# Patient Record
Sex: Female | Born: 1945 | Race: White | Hispanic: No | Smoking: Never smoker
Health system: Southern US, Community
[De-identification: ages and names within clinical notes are randomized; demographics above are authoritative.]

## PROBLEM LIST (undated history)

## (undated) DIAGNOSIS — C189 Malignant neoplasm of colon, unspecified: Secondary | ICD-10-CM

## (undated) DIAGNOSIS — E119 Type 2 diabetes mellitus without complications: Secondary | ICD-10-CM

## (undated) HISTORY — PX: OTHER SURGICAL HISTORY: SHX169

## (undated) HISTORY — DX: Type 2 diabetes mellitus without complications: E11.9

## (undated) HISTORY — DX: Malignant neoplasm of colon, unspecified: C18.9

---

## 2005-08-17 ENCOUNTER — Emergency Department (HOSPITAL_COMMUNITY): Admission: EM | Admit: 2005-08-17 | Discharge: 2005-08-17 | Payer: Self-pay | Admitting: Emergency Medicine

## 2015-06-25 ENCOUNTER — Ambulatory Visit: Payer: Self-pay | Admitting: "Endocrinology

## 2015-08-23 ENCOUNTER — Ambulatory Visit: Payer: Self-pay | Admitting: "Endocrinology

## 2015-10-02 ENCOUNTER — Ambulatory Visit: Payer: Self-pay | Admitting: "Endocrinology

## 2015-10-21 ENCOUNTER — Other Ambulatory Visit: Payer: Self-pay | Admitting: "Endocrinology

## 2015-10-21 ENCOUNTER — Ambulatory Visit (INDEPENDENT_AMBULATORY_CARE_PROVIDER_SITE_OTHER): Payer: PPO | Admitting: "Endocrinology

## 2015-10-21 ENCOUNTER — Encounter: Payer: Self-pay | Admitting: "Endocrinology

## 2015-10-21 VITALS — BP 162/72 | HR 92 | Ht 62.0 in | Wt 275.0 lb

## 2015-10-21 DIAGNOSIS — I10 Essential (primary) hypertension: Secondary | ICD-10-CM | POA: Diagnosis not present

## 2015-10-21 DIAGNOSIS — E785 Hyperlipidemia, unspecified: Secondary | ICD-10-CM | POA: Insufficient documentation

## 2015-10-21 DIAGNOSIS — E1165 Type 2 diabetes mellitus with hyperglycemia: Secondary | ICD-10-CM | POA: Insufficient documentation

## 2015-10-21 DIAGNOSIS — E118 Type 2 diabetes mellitus with unspecified complications: Secondary | ICD-10-CM

## 2015-10-21 DIAGNOSIS — IMO0002 Reserved for concepts with insufficient information to code with codable children: Secondary | ICD-10-CM | POA: Insufficient documentation

## 2015-10-21 MED ORDER — GLUCOSE BLOOD VI STRP: ORAL_STRIP | Status: DC

## 2015-10-21 NOTE — Progress Notes (Signed)
Subjective:    Patient ID: Deborah Love, female    DOB: 1946/05/27. Patient is being seen in consultation for management of diabetes requested by  VYAS,DHRUV B., MD  Past Medical History  Diagnosis Date  . Diabetes mellitus, type II ([place])   . Colon cancer [facility])    Past Surgical History  Procedure Laterality Date  . Colon cancer     Social History   Social History  . Marital Status: Divorced    Spouse Name: N/A  . Number of Children: N/A  . Years of Education: N/A   Social History Main Topics  . Smoking status: Never Smoker   . Smokeless tobacco: None  . Alcohol Use: None  . Drug Use: No  . Sexual Activity: Not Asked   Other Topics Concern  . None   Social History Narrative  . None   Outpatient Encounter Prescriptions as of 10/21/2015  Medication Sig  . aspirin 81 MG tablet Take 81 mg by mouth daily.  . Canagliflozin (INVOKANA PO) Take 100 mg by mouth. Taking samples from PMD  . lisinopril (PRINIVIL,ZESTRIL) 10 MG tablet Take 10 mg by mouth daily.  . metFORMIN (GLUCOPHAGE) 1000 MG tablet Take 1,000 mg by mouth 2 (two) times daily with a meal.  . glucose blood (ONE TOUCH TEST STRIPS) test strip Use as instructed   No facility-administered encounter medications on file as of 10/21/2015.   ALLERGIES: Allergies  Allergen Reactions  . Penicillins    VACCINATION STATUS:  There is no immunization history on file for this patient.  Diabetes She presents for her initial diabetic visit. She has type 2 diabetes mellitus. Onset time: She was diagnosed at approximate age of 61 years. Her disease course has been worsening. There are no hypoglycemic associated symptoms. Pertinent negatives for hypoglycemia include no confusion, headaches, pallor or seizures. Associated symptoms include fatigue, polydipsia and polyuria. Pertinent negatives for diabetes include no chest pain and no polyphagia. There are no hypoglycemic complications. Symptoms are worsening. There are no diabetic  complications. Risk factors for coronary artery disease include diabetes mellitus, hypertension, obesity and sedentary lifestyle. Her weight is decreasing steadily. She is following a generally unhealthy diet. She has not had a previous visit with a dietitian. She rarely participates in exercise. Home blood sugar record trend: She did not bring any meter nor log to review today. Eye exam is current.  Hypertension This is a chronic problem. The current episode started more than 1 year ago. Pertinent negatives include no chest pain, headaches, palpitations or shortness of breath. Risk factors for coronary artery disease include diabetes mellitus, obesity and sedentary lifestyle. Past treatments include ACE inhibitors.      Review of Systems  Constitutional: Positive for fatigue. Negative for fever, chills and unexpected weight change.  HENT: Negative for trouble swallowing and voice change.   Eyes: Negative for visual disturbance.  Respiratory: Negative for cough, shortness of breath and wheezing.   Cardiovascular: Negative for chest pain, palpitations and leg swelling.  Gastrointestinal: Negative for nausea, vomiting and diarrhea.  Endocrine: Positive for polydipsia and polyuria. Negative for cold intolerance, heat intolerance and polyphagia.  Musculoskeletal: Negative for myalgias and arthralgias.  Skin: Negative for color change, pallor, rash and wound.  Neurological: Negative for seizures and headaches.  Psychiatric/Behavioral: Negative for suicidal ideas and confusion.    Objective:    BP 162/72 mmHg  Pulse 92  Ht 5\' 2"  (1.575 m)  Wt 275 lb (124.739 kg)  BMI 50.29 kg/m2  SpO2  95%  Wt Readings from Last 3 Encounters:  10/21/15 275 lb (124.739 kg)    Physical Exam  Constitutional: She is oriented to person, place, and time. She appears well-developed.  HENT:  Head: Atraumatic.  Eyes: EOM are normal.  Neck: Normal range of motion. Neck supple. No tracheal deviation present. No  thyromegaly present.  Cardiovascular: Normal rate and regular rhythm.   Pulmonary/Chest: Effort normal and breath sounds normal.  Abdominal: Soft. Bowel sounds are normal. There is no tenderness. There is no guarding.  Truncal obesity.  Musculoskeletal: Normal range of motion. She exhibits no edema.  Neurological: She is alert and oriented to person, place, and time. She has normal reflexes. No cranial nerve deficit. Coordination normal.  Skin: Skin is warm and dry. No rash noted. No erythema. No pallor.  Psychiatric: She has a normal mood and affect. Judgment normal.    Her referral paperwork short A1c of 10.7% from 05/07/2015.  She has no recent labs to review.  Assessment & Plan:   1. Uncontrolled type 2 diabetes mellitus with complication, without long-term current use of insulin ([place])  - Patient has currently uncontrolled symptomatic type 2 DM since  70 years of age,  with most recent A1c of 10.7 %. -No recent labs to review.  I will send her to lab for new set of labs.   Her diabetes is complicated by obesity and patient remains at a high risk for more acute and chronic complications of diabetes which include CAD, CVA, CKD, retinopathy, and neuropathy. These are all discussed in detail with the patient.  - I have counseled the patient on diet management and weight loss, by adopting a carbohydrate restricted/protein rich diet.  - Suggestion is made for patient to avoid simple carbohydrates   from their diet including Cakes , Desserts, Ice Cream,  Soda (  diet and regular) , Sweet Tea , Candies,  Chips, Cookies, Artificial Sweeteners,   and "Sugar-free" Products . This will help patient to have stable blood glucose profile and potentially avoid unintended weight gain.  - I encouraged the patient to switch to  unprocessed or minimally processed complex starch and increased protein intake (animal or plant source), fruits, and vegetables.  - Patient is advised to stick to a routine  mealtimes to eat 3 meals  a day and avoid unnecessary snacks ( to snack only to correct hypoglycemia).  - The patient will be scheduled with Jearld Fenton, RDN, CDE for individualized DM education.  - I have approached patient with the following individualized plan to manage diabetes and patient agrees:   - I  will initiate  strict monitoring of glucose  AC and HS. -She may need at least basal insulin based on her readings.  -Patient is encouraged to call clinic for blood glucose levels less than 70 or above 300 mg /dl. - I will continue  metformin 1000 mg by mouth twice a day and INVOKANA for now until we get a chance to review her repeat renal function. - Patient will be considered for incretin therapy as appropriate next visit. - Patient specific target  A1c;  LDL, HDL, Triglycerides, and  Waist Circumference were discussed in detail.  2) BP/HTN:  Uncontrolled. Continue current medications including ACEI/ARB. 3) Lipids/HPL:  Lipid panel unknown, she is not on statins. 4)  Weight/Diet: CDE Consult will be initiated , exercise, and detailed carbohydrates information provided.  5) Chronic Care/Health Maintenance:  -Patient is  ACEI medications and encouraged to continue to follow up  with Ophthalmology, Podiatrist at least yearly or according to recommendations, and advised to   stay away from smoking. I have recommended yearly flu vaccine and pneumonia vaccination at least every 5 years; moderate intensity exercise for up to 150 minutes weekly; and  sleep for at least 7 hours a day.  - 60 minutes of time was spent on the care of this patient , 50% of which was applied for counseling on diabetes complications and their preventions.  - Patient to bring meter and  blood glucose logs during their next visit.   - I advised patient to maintain close follow up with VYAS,DHRUV B., MD for primary care needs.  Follow up plan: - Return in about 10 days (around 10/31/2015) for diabetes, high blood  pressure, follow up with pre-visit labs, meter, and logs.  Glade Lloyd, MD Phone: [phone]  Fax: [phone]   10/21/2015, 3:05 PM

## 2015-10-22 ENCOUNTER — Other Ambulatory Visit: Payer: Self-pay

## 2015-10-22 LAB — BASIC METABOLIC PANEL
BUN / CREAT RATIO: 27 — AB (ref 11–26)
BUN: 23 mg/dL (ref 8–27)
CHLORIDE: 99 mmol/L (ref 96–106)
CO2: 22 mmol/L (ref 18–29)
Calcium: 10.2 mg/dL (ref 8.7–10.3)
Creatinine, Ser: 0.85 mg/dL (ref 0.57–1.00)
GFR calc Af Amer: 82 mL/min/{1.73_m2} (ref >59)
GFR calc non Af Amer: 71 mL/min/{1.73_m2} (ref >59)
GLUCOSE: 118 mg/dL — AB (ref 65–99)
POTASSIUM: 4.7 mmol/L (ref 3.5–5.2)
SODIUM: 140 mmol/L (ref 134–144)

## 2015-10-22 LAB — T4, FREE: FREE T4: 0.98 ng/dL (ref 0.82–1.77)

## 2015-10-22 LAB — TSH: TSH: 2.35 u[IU]/mL (ref 0.450–4.500)

## 2015-10-22 LAB — HGB A1C W/O EAG: HEMOGLOBIN A1C: 7.4 % — AB (ref 4.8–5.6)

## 2015-10-22 MED ORDER — GLUCOSE BLOOD VI STRP: ORAL_STRIP | Status: DC

## 2015-10-24 ENCOUNTER — Other Ambulatory Visit: Payer: Self-pay

## 2015-10-24 MED ORDER — GLUCOSE BLOOD VI STRP: ORAL_STRIP | Status: AC

## 2015-10-31 ENCOUNTER — Other Ambulatory Visit: Payer: Self-pay

## 2015-10-31 ENCOUNTER — Ambulatory Visit (INDEPENDENT_AMBULATORY_CARE_PROVIDER_SITE_OTHER): Payer: PPO | Admitting: "Endocrinology

## 2015-10-31 ENCOUNTER — Encounter: Payer: Self-pay | Admitting: "Endocrinology

## 2015-10-31 VITALS — BP 154/81 | HR 68 | Ht 62.0 in | Wt 277.0 lb

## 2015-10-31 DIAGNOSIS — E1165 Type 2 diabetes mellitus with hyperglycemia: Secondary | ICD-10-CM | POA: Diagnosis not present

## 2015-10-31 DIAGNOSIS — I10 Essential (primary) hypertension: Secondary | ICD-10-CM | POA: Diagnosis not present

## 2015-10-31 DIAGNOSIS — E118 Type 2 diabetes mellitus with unspecified complications: Secondary | ICD-10-CM

## 2015-10-31 DIAGNOSIS — E785 Hyperlipidemia, unspecified: Secondary | ICD-10-CM | POA: Diagnosis not present

## 2015-10-31 DIAGNOSIS — IMO0002 Reserved for concepts with insufficient information to code with codable children: Secondary | ICD-10-CM

## 2015-10-31 MED ORDER — CANAGLIFLOZIN 100 MG PO TABS: 100.0000 mg | ORAL_TABLET | Freq: Every day | ORAL | Status: DC

## 2015-10-31 NOTE — Patient Instructions (Signed)

## 2015-10-31 NOTE — Progress Notes (Signed)
Subjective:    Patient ID: Deborah Love, female    DOB: 03-03-1946. Patient is being seen in  Follow-up for  management of diabetes requested by  VYAS,DHRUV B., MD  Past Medical History  Diagnosis Date  . Diabetes mellitus, type II ([place])   . Colon cancer [facility])    Past Surgical History  Procedure Laterality Date  . Colon cancer     Social History   Social History  . Marital Status: Divorced    Spouse Name: N/A  . Number of Children: N/A  . Years of Education: N/A   Social History Main Topics  . Smoking status: Never Smoker   . Smokeless tobacco: None  . Alcohol Use: None  . Drug Use: No  . Sexual Activity: Not Asked   Other Topics Concern  . None   Social History Narrative   Outpatient Encounter Prescriptions as of 10/31/2015  Medication Sig  . aspirin 81 MG tablet Take 81 mg by mouth daily.  . canagliflozin (INVOKANA) 100 MG TABS tablet Take 1 tablet (100 mg total) by mouth daily before breakfast. Taking samples from PMD  . glucose blood (ONETOUCH VERIO) test strip Use as instructed 4 x daily. E11.65  . lisinopril (PRINIVIL,ZESTRIL) 10 MG tablet Take 10 mg by mouth daily.  . metFORMIN (GLUCOPHAGE) 1000 MG tablet Take 1,000 mg by mouth 2 (two) times daily with a meal.  . [DISCONTINUED] Canagliflozin (INVOKANA PO) Take 100 mg by mouth. Taking samples from PMD   No facility-administered encounter medications on file as of 10/31/2015.   ALLERGIES: Allergies  Allergen Reactions  . Penicillins    VACCINATION STATUS:  There is no immunization history on file for this patient.  Diabetes She presents for her follow-up diabetic visit. She has type 2 diabetes mellitus. Onset time: She was diagnosed at approximate age of 4 years. Her disease course has been improving. There are no hypoglycemic associated symptoms. Pertinent negatives for hypoglycemia include no confusion, headaches, pallor or seizures. Associated symptoms include fatigue. Pertinent negatives for  diabetes include no chest pain, no polydipsia, no polyphagia and no polyuria. There are no hypoglycemic complications. Symptoms are improving. There are no diabetic complications. Risk factors for coronary artery disease include diabetes mellitus, hypertension, obesity and sedentary lifestyle. Her weight is stable. She is following a generally unhealthy diet. She has not had a previous visit with a dietitian. She rarely participates in exercise. Home blood sugar record trend: She came with average blood glucose of 123 in the last week. Her breakfast blood glucose range is generally 110-130 mg/dl. Her lunch blood glucose range is generally 110-130 mg/dl. Her dinner blood glucose range is generally 110-130 mg/dl. Her overall blood glucose range is 110-130 mg/dl. Eye exam is current.  Hypertension This is a chronic problem. The current episode started more than 1 year ago. Pertinent negatives include no chest pain, headaches, palpitations or shortness of breath. Risk factors for coronary artery disease include diabetes mellitus, obesity and sedentary lifestyle. Past treatments include ACE inhibitors.      Review of Systems  Constitutional: Positive for fatigue. Negative for fever, chills and unexpected weight change.  HENT: Negative for trouble swallowing and voice change.   Eyes: Negative for visual disturbance.  Respiratory: Negative for cough, shortness of breath and wheezing.   Cardiovascular: Negative for chest pain, palpitations and leg swelling.  Gastrointestinal: Negative for nausea, vomiting and diarrhea.  Endocrine: Negative for cold intolerance, heat intolerance, polydipsia, polyphagia and polyuria.  Musculoskeletal: Negative for myalgias  and arthralgias.  Skin: Negative for color change, pallor, rash and wound.  Neurological: Negative for seizures and headaches.  Psychiatric/Behavioral: Negative for suicidal ideas and confusion.    Objective:    BP 154/81 mmHg  Pulse 68  Ht 5\' 2"   (1.575 m)  Wt 277 lb (125.646 kg)  BMI 50.65 kg/m2  SpO2 98%  Wt Readings from Last 3 Encounters:  10/31/15 277 lb (125.646 kg)  10/21/15 275 lb (124.739 kg)    Physical Exam  Constitutional: She is oriented to person, place, and time. She appears well-developed.  HENT:  Head: Atraumatic.  Eyes: EOM are normal.  Neck: Normal range of motion. Neck supple. No tracheal deviation present. No thyromegaly present.  Cardiovascular: Normal rate and regular rhythm.   Pulmonary/Chest: Effort normal and breath sounds normal.  Abdominal: Soft. Bowel sounds are normal. There is no tenderness. There is no guarding.  Truncal obesity.  Musculoskeletal: Normal range of motion. She exhibits no edema.  Neurological: She is alert and oriented to person, place, and time. She has normal reflexes. No cranial nerve deficit. Coordination normal.  Skin: Skin is warm and dry. No rash noted. No erythema. No pallor.  Psychiatric: She has a normal mood and affect. Judgment normal.    Her referral paperwork short A1c of 10.7% from 05/07/2015.  She has no recent labs to review.  Assessment & Plan:   1. Uncontrolled type 2 diabetes mellitus with complication, without long-term current use of insulin ([place])  - Patient has currently uncontrolled symptomatic type 2 DM since  70 years of age,  with most recent A1c of 7.4% improving from 10.7 %.    Her diabetes is complicated by obesity and patient remains at a high risk for more acute and chronic complications of diabetes which include CAD, CVA, CKD, retinopathy, and neuropathy. These are all discussed in detail with the patient.  - I have counseled the patient on diet management and weight loss, by adopting a carbohydrate restricted/protein rich diet.  - Suggestion is made for patient to avoid simple carbohydrates   from their diet including Cakes , Desserts, Ice Cream,  Soda (  diet and regular) , Sweet Tea , Candies,  Chips, Cookies, Artificial Sweeteners,   and  "Sugar-free" Products . This will help patient to have stable blood glucose profile and potentially avoid unintended weight gain.  - I encouraged the patient to switch to  unprocessed or minimally processed complex starch and increased protein intake (animal or plant source), fruits, and vegetables.  - Patient is advised to stick to a routine mealtimes to eat 3 meals  a day and avoid unnecessary snacks ( to snack only to correct hypoglycemia).  - The patient will be scheduled with Jearld Fenton, RDN, CDE for individualized DM education.  - I have approached patient with the following individualized plan to manage diabetes and patient agrees:   -She will not need  insulin therapy based on her readings.  - I will continue  metformin 1000 mg by mouth twice a day and INVOKANA 100 mg with breakfast .  - Patient will be considered for incretin therapy as appropriate next visit. - Patient specific target  A1c;  LDL, HDL, Triglycerides, and  Waist Circumference were discussed in detail.  2) BP/HTN:  Uncontrolled. Continue current medications including ACEI/ARB. 3) Lipids/HPL:  Lipid panel unknown, she is not on statins. 4)  Weight/Diet: CDE Consult will be initiated , exercise, and detailed carbohydrates information provided.  5) Chronic Care/Health Maintenance:  -  Patient is  ACEI medications and encouraged to continue to follow up with Ophthalmology, Podiatrist at least yearly or according to recommendations, and advised to   stay away from smoking. I have recommended yearly flu vaccine and pneumonia vaccination at least every 5 years; moderate intensity exercise for up to 150 minutes weekly; and  sleep for at least 7 hours a day.  - 25 minutes of time was spent on the care of this patient , 50% of which was applied for counseling on diabetes complications and their preventions.  - Patient to bring meter and  blood glucose logs during their next visit.   - I advised patient to maintain close  follow up with VYAS,DHRUV B., MD for primary care needs.  Follow up plan: - Return in about 3 months (around 01/31/2016) for diabetes, high blood pressure, high cholesterol, follow up with pre-visit labs.  Glade Lloyd, MD Phone: [phone]  Fax: [phone]   10/31/2015, 11:26 AM

## 2015-12-12 ENCOUNTER — Telehealth: Payer: Self-pay | Admitting: "Endocrinology

## 2015-12-12 MED ORDER — METFORMIN HCL 1000 MG PO TABS: 1000.0000 mg | ORAL_TABLET | Freq: Two times a day (BID) | ORAL | Status: DC

## 2015-12-12 NOTE — Telephone Encounter (Signed)
Pt needs metformin sent to [facility] in [place], [address], [place]

## 2016-02-07 ENCOUNTER — Ambulatory Visit: Payer: PPO | Admitting: "Endocrinology

## 2016-03-23 ENCOUNTER — Other Ambulatory Visit: Payer: Self-pay | Admitting: "Endocrinology

## 2016-06-22 ENCOUNTER — Other Ambulatory Visit: Payer: Self-pay | Admitting: "Endocrinology

## 2016-06-22 DIAGNOSIS — E118 Type 2 diabetes mellitus with unspecified complications: Secondary | ICD-10-CM | POA: Diagnosis not present

## 2016-06-22 DIAGNOSIS — E785 Hyperlipidemia, unspecified: Secondary | ICD-10-CM | POA: Diagnosis not present

## 2016-06-22 DIAGNOSIS — E1165 Type 2 diabetes mellitus with hyperglycemia: Secondary | ICD-10-CM | POA: Diagnosis not present

## 2016-06-23 LAB — LIPID PANEL W/O CHOL/HDL RATIO
CHOLESTEROL TOTAL: 163 mg/dL (ref 100–199)
HDL: 53 mg/dL (ref >39)
LDL CALC: 79 mg/dL (ref 0–99)
Triglycerides: 153 mg/dL — ABNORMAL HIGH (ref 0–149)
VLDL Cholesterol Cal: 31 mg/dL (ref 5–40)

## 2016-06-23 LAB — BASIC METABOLIC PANEL
BUN / CREAT RATIO: 32 — AB (ref 12–28)
BUN: 27 mg/dL (ref 8–27)
CHLORIDE: 100 mmol/L (ref 96–106)
CO2: 26 mmol/L (ref 18–29)
CREATININE: 0.84 mg/dL (ref 0.57–1.00)
Calcium: 10.2 mg/dL (ref 8.7–10.3)
GFR calc non Af Amer: 72 mL/min/{1.73_m2} (ref >59)
GFR, EST AFRICAN AMERICAN: 83 mL/min/{1.73_m2} (ref >59)
GLUCOSE: 110 mg/dL — AB (ref 65–99)
Potassium: 4.9 mmol/L (ref 3.5–5.2)
SODIUM: 143 mmol/L (ref 134–144)

## 2016-06-23 LAB — HGB A1C W/O EAG: Hgb A1c MFr Bld: 6.6 % — ABNORMAL HIGH (ref 4.8–5.6)

## 2016-06-30 ENCOUNTER — Ambulatory Visit (INDEPENDENT_AMBULATORY_CARE_PROVIDER_SITE_OTHER): Payer: PPO | Admitting: "Endocrinology

## 2016-06-30 ENCOUNTER — Encounter: Payer: Self-pay | Admitting: "Endocrinology

## 2016-06-30 ENCOUNTER — Other Ambulatory Visit: Payer: Self-pay

## 2016-06-30 VITALS — BP 161/90 | HR 62 | Ht 62.0 in | Wt 276.0 lb

## 2016-06-30 DIAGNOSIS — E118 Type 2 diabetes mellitus with unspecified complications: Secondary | ICD-10-CM | POA: Diagnosis not present

## 2016-06-30 DIAGNOSIS — E1165 Type 2 diabetes mellitus with hyperglycemia: Secondary | ICD-10-CM | POA: Diagnosis not present

## 2016-06-30 DIAGNOSIS — E782 Mixed hyperlipidemia: Secondary | ICD-10-CM

## 2016-06-30 DIAGNOSIS — I10 Essential (primary) hypertension: Secondary | ICD-10-CM

## 2016-06-30 DIAGNOSIS — IMO0002 Reserved for concepts with insufficient information to code with codable children: Secondary | ICD-10-CM

## 2016-06-30 MED ORDER — CANAGLIFLOZIN 100 MG PO TABS: 100.0000 mg | ORAL_TABLET | Freq: Every day | ORAL | 3 refills | Status: DC

## 2016-06-30 MED ORDER — LISINOPRIL 10 MG PO TABS: 10.0000 mg | ORAL_TABLET | Freq: Every day | ORAL | 3 refills | Status: DC

## 2016-06-30 MED ORDER — METFORMIN HCL 1000 MG PO TABS: 1000.0000 mg | ORAL_TABLET | Freq: Two times a day (BID) | ORAL | 3 refills | Status: DC

## 2016-06-30 NOTE — Progress Notes (Signed)
Subjective:    Patient ID: Deborah Love, female    DOB: 10/14/1945. Patient is being seen in  Follow-up for  management of diabetes requested by  Glenda Chroman, MD  Past Medical History:  Diagnosis Date  . Colon cancer ([place])   . Diabetes mellitus, type II [facility])    Past Surgical History:  Procedure Laterality Date  . colon cancer     Social History   Social History  . Marital status: Divorced    Spouse name: N/A  . Number of children: N/A  . Years of education: N/A   Social History Main Topics  . Smoking status: Never Smoker  . Smokeless tobacco: Never Used  . Alcohol use None  . Drug use: No  . Sexual activity: Not Asked   Other Topics Concern  . None   Social History Narrative  . None   Outpatient Encounter Prescriptions as of 06/30/2016  Medication Sig  . aspirin 81 MG tablet Take 81 mg by mouth daily.  . canagliflozin (INVOKANA) 100 MG TABS tablet Take 1 tablet (100 mg total) by mouth daily before breakfast.  . glucose blood (ONETOUCH VERIO) test strip Use as instructed 4 x daily. E11.65  . lisinopril (PRINIVIL,ZESTRIL) 10 MG tablet Take 1 tablet (10 mg total) by mouth daily.  . metFORMIN (GLUCOPHAGE) 1000 MG tablet Take 1 tablet (1,000 mg total) by mouth 2 (two) times daily with a meal.  . [DISCONTINUED] canagliflozin (INVOKANA) 100 MG TABS tablet Take 1 tablet (100 mg total) by mouth daily before breakfast. Taking samples from PMD  . [DISCONTINUED] lisinopril (PRINIVIL,ZESTRIL) 10 MG tablet Take 10 mg by mouth daily.  . [DISCONTINUED] metFORMIN (GLUCOPHAGE) 1000 MG tablet TAKE ONE TABLET BY MOUTH TWICE DAILY WITH MEALS   No facility-administered encounter medications on file as of 06/30/2016.    ALLERGIES: Allergies  Allergen Reactions  . Penicillins    VACCINATION STATUS:  There is no immunization history on file for this patient.  Diabetes  She presents for her follow-up diabetic visit. She has type 2 diabetes mellitus. Onset time: She was  diagnosed at approximate age of 63 years. Her disease course has been improving. There are no hypoglycemic associated symptoms. Pertinent negatives for hypoglycemia include no confusion, headaches, pallor or seizures. Associated symptoms include fatigue. Pertinent negatives for diabetes include no chest pain, no polydipsia, no polyphagia and no polyuria. There are no hypoglycemic complications. Symptoms are improving. There are no diabetic complications. Risk factors for coronary artery disease include diabetes mellitus, hypertension, obesity and sedentary lifestyle. Her weight is stable. She is following a generally unhealthy diet. She has not had a previous visit with a dietitian. She rarely participates in exercise. Home blood sugar record trend: She came with average blood glucose of 123 in the last week. Her breakfast blood glucose range is generally 110-130 mg/dl. Her lunch blood glucose range is generally 110-130 mg/dl. Her dinner blood glucose range is generally 110-130 mg/dl. Her overall blood glucose range is 110-130 mg/dl. Eye exam is current.  Hypertension  This is a chronic problem. The current episode started more than 1 year ago. Pertinent negatives include no chest pain, headaches, palpitations or shortness of breath. Risk factors for coronary artery disease include diabetes mellitus, obesity and sedentary lifestyle. Past treatments include ACE inhibitors.      Review of Systems  Constitutional: Positive for fatigue. Negative for chills, fever and unexpected weight change.  HENT: Negative for trouble swallowing and voice change.   Eyes: Negative  for visual disturbance.  Respiratory: Negative for cough, shortness of breath and wheezing.   Cardiovascular: Negative for chest pain, palpitations and leg swelling.  Gastrointestinal: Negative for diarrhea, nausea and vomiting.  Endocrine: Negative for cold intolerance, heat intolerance, polydipsia, polyphagia and polyuria.  Musculoskeletal:  Negative for arthralgias and myalgias.  Skin: Negative for color change, pallor, rash and wound.  Neurological: Negative for seizures and headaches.  Psychiatric/Behavioral: Negative for confusion and suicidal ideas.    Objective:    BP (!) 161/90   Pulse 62   Ht 5\' 2"  (1.575 m)   Wt 276 lb (125.2 kg)   BMI 50.48 kg/m   Wt Readings from Last 3 Encounters:  06/30/16 276 lb (125.2 kg)  10/31/15 277 lb (125.6 kg)  10/21/15 275 lb (124.7 kg)    Physical Exam  Constitutional: She is oriented to person, place, and time. She appears well-developed.  HENT:  Head: Atraumatic.  Eyes: EOM are normal.  Neck: Normal range of motion. Neck supple. No tracheal deviation present. No thyromegaly present.  Cardiovascular: Normal rate and regular rhythm.   Pulmonary/Chest: Effort normal and breath sounds normal.  Abdominal: Soft. Bowel sounds are normal. There is no tenderness. There is no guarding.  Truncal obesity.  Musculoskeletal: Normal range of motion. She exhibits no edema.  Neurological: She is alert and oriented to person, place, and time. She has normal reflexes. No cranial nerve deficit. Coordination normal.  Skin: Skin is warm and dry. No rash noted. No erythema. No pallor.  Psychiatric: She has a normal mood and affect. Judgment normal.    Her referral paperwork short A1c of 10.7% from 05/07/2015.  She has no recent labs to review.  Assessment & Plan:   1. Uncontrolled type 2 diabetes mellitus with complication, without long-term current use of insulin ([place])  - Patient has currently uncontrolled symptomatic type 2 DM since  70 years of age,  with most recent A1c of 6.6% improving from 10.7 %.    Her diabetes is complicated by obesity and patient remains at a high risk for more acute and chronic complications of diabetes which include CAD, CVA, CKD, retinopathy, and neuropathy. These are all discussed in detail with the patient.  - I have counseled the patient on diet  management and weight loss, by adopting a carbohydrate restricted/protein rich diet.  - Suggestion is made for patient to avoid simple carbohydrates   from their diet including Cakes , Desserts, Ice Cream,  Soda (  diet and regular) , Sweet Tea , Candies,  Chips, Cookies, Artificial Sweeteners,   and "Sugar-free" Products . This will help patient to have stable blood glucose profile and potentially avoid unintended weight gain.  - I encouraged the patient to switch to  unprocessed or minimally processed complex starch and increased protein intake (animal or plant source), fruits, and vegetables.  - Patient is advised to stick to a routine mealtimes to eat 3 meals  a day and avoid unnecessary snacks ( to snack only to correct hypoglycemia).  - The patient will be scheduled with Jearld Fenton, RDN, CDE for individualized DM education.  - I have approached patient with the following individualized plan to manage diabetes and patient agrees:   -She will not need  insulin therapy based on her readings. However, she couldn't continue to benefit from [place].  - I will continue  metformin 1000 mg by mouth twice a day and she agrees to resume INVOKANA 100 mg with breakfast .  - Patient will  be considered for incretin therapy as appropriate next visit. - Patient specific target  A1c;  LDL, HDL, Triglycerides, and  Waist Circumference were discussed in detail.  2) BP/HTN:  Uncontrolled. Continue current medications including ACEI/ARB. I will refill her lisinopril. 3) Lipids/HPL:  Lipid panel is consistent with controlled profile with LDL 79, she is not on statins. 4)  Weight/Diet: CDE Consult will be initiated , exercise, and detailed carbohydrates information provided.  5) Chronic Care/Health Maintenance:  -Patient is  ACEI medications and encouraged to continue to follow up with Ophthalmology, Podiatrist at least yearly or according to recommendations, and advised to   stay away from smoking. I  have recommended yearly flu vaccine and pneumonia vaccination at least every 5 years; moderate intensity exercise for up to 150 minutes weekly; and  sleep for at least 7 hours a day.  - 25 minutes of time was spent on the care of this patient , 50% of which was applied for counseling on diabetes complications and their preventions.  - Patient to bring meter and  blood glucose logs during their next visit.   - I advised patient to maintain close follow up with Glenda Chroman, MD for primary care needs.  Follow up plan: - Return in about 4 months (around 10/28/2016) for follow up with pre-visit labs.  Glade Lloyd, MD Phone: [phone]  Fax: [phone]   06/30/2016, 10:11 AM

## 2016-06-30 NOTE — Patient Instructions (Signed)

## 2016-10-27 ENCOUNTER — Other Ambulatory Visit: Payer: Self-pay

## 2016-10-27 MED ORDER — LISINOPRIL 10 MG PO TABS: 10.0000 mg | ORAL_TABLET | Freq: Every day | ORAL | 1 refills | Status: AC

## 2016-10-27 MED ORDER — CANAGLIFLOZIN 100 MG PO TABS
100.0000 mg | ORAL_TABLET | Freq: Every day | ORAL | 0 refills | Status: AC
Start: 2016-10-27 — End: ?

## 2016-10-27 MED ORDER — METFORMIN HCL 1000 MG PO TABS: 1000.0000 mg | ORAL_TABLET | Freq: Two times a day (BID) | ORAL | 0 refills | Status: DC

## 2016-10-28 ENCOUNTER — Ambulatory Visit: Payer: PPO | Admitting: "Endocrinology

## 2016-11-05 ENCOUNTER — Other Ambulatory Visit: Payer: Self-pay | Admitting: "Endocrinology

## 2016-11-16 ENCOUNTER — Other Ambulatory Visit: Payer: Self-pay | Admitting: "Endocrinology

## 2017-03-18 ENCOUNTER — Other Ambulatory Visit: Payer: Self-pay | Admitting: "Endocrinology

## 2017-04-12 ENCOUNTER — Other Ambulatory Visit: Payer: Self-pay

## 2017-10-04 DIAGNOSIS — Z713 Dietary counseling and surveillance: Secondary | ICD-10-CM | POA: Diagnosis not present

## 2017-10-04 DIAGNOSIS — Z789 Other specified health status: Secondary | ICD-10-CM | POA: Diagnosis not present

## 2017-10-04 DIAGNOSIS — E1165 Type 2 diabetes mellitus with hyperglycemia: Secondary | ICD-10-CM | POA: Diagnosis not present

## 2017-10-04 DIAGNOSIS — Z6841 Body Mass Index (BMI) 40.0 and over, adult: Secondary | ICD-10-CM | POA: Diagnosis not present

## 2017-10-04 DIAGNOSIS — Z299 Encounter for prophylactic measures, unspecified: Secondary | ICD-10-CM | POA: Diagnosis not present

## 2018-01-24 ENCOUNTER — Other Ambulatory Visit: Payer: Self-pay | Admitting: "Endocrinology

## 2018-09-09 DIAGNOSIS — L97909 Non-pressure chronic ulcer of unspecified part of unspecified lower leg with unspecified severity: Secondary | ICD-10-CM | POA: Diagnosis not present

## 2018-09-09 DIAGNOSIS — I10 Essential (primary) hypertension: Secondary | ICD-10-CM | POA: Diagnosis not present

## 2018-09-09 DIAGNOSIS — Z6841 Body Mass Index (BMI) 40.0 and over, adult: Secondary | ICD-10-CM | POA: Diagnosis not present

## 2018-09-09 DIAGNOSIS — I83009 Varicose veins of unspecified lower extremity with ulcer of unspecified site: Secondary | ICD-10-CM | POA: Diagnosis not present

## 2018-09-09 DIAGNOSIS — Z299 Encounter for prophylactic measures, unspecified: Secondary | ICD-10-CM | POA: Diagnosis not present

## 2018-09-09 DIAGNOSIS — E1165 Type 2 diabetes mellitus with hyperglycemia: Secondary | ICD-10-CM | POA: Diagnosis not present

## 2018-09-20 DIAGNOSIS — Z79899 Other long term (current) drug therapy: Secondary | ICD-10-CM | POA: Diagnosis not present

## 2018-09-20 DIAGNOSIS — L97919 Non-pressure chronic ulcer of unspecified part of right lower leg with unspecified severity: Secondary | ICD-10-CM | POA: Diagnosis not present

## 2018-09-20 DIAGNOSIS — I872 Venous insufficiency (chronic) (peripheral): Secondary | ICD-10-CM | POA: Diagnosis not present

## 2018-09-20 DIAGNOSIS — E119 Type 2 diabetes mellitus without complications: Secondary | ICD-10-CM | POA: Diagnosis not present

## 2018-09-20 DIAGNOSIS — L97519 Non-pressure chronic ulcer of other part of right foot with unspecified severity: Secondary | ICD-10-CM | POA: Diagnosis not present

## 2018-09-20 DIAGNOSIS — Z7984 Long term (current) use of oral hypoglycemic drugs: Secondary | ICD-10-CM | POA: Diagnosis not present

## 2018-09-27 DIAGNOSIS — E785 Hyperlipidemia, unspecified: Secondary | ICD-10-CM | POA: Diagnosis not present

## 2018-09-27 DIAGNOSIS — I10 Essential (primary) hypertension: Secondary | ICD-10-CM | POA: Diagnosis not present

## 2018-09-27 DIAGNOSIS — E1165 Type 2 diabetes mellitus with hyperglycemia: Secondary | ICD-10-CM | POA: Diagnosis not present

## 2018-09-27 DIAGNOSIS — Z299 Encounter for prophylactic measures, unspecified: Secondary | ICD-10-CM | POA: Diagnosis not present

## 2018-09-27 DIAGNOSIS — Z6841 Body Mass Index (BMI) 40.0 and over, adult: Secondary | ICD-10-CM | POA: Diagnosis not present

## 2018-10-04 DIAGNOSIS — L97519 Non-pressure chronic ulcer of other part of right foot with unspecified severity: Secondary | ICD-10-CM | POA: Diagnosis not present

## 2018-10-04 DIAGNOSIS — I872 Venous insufficiency (chronic) (peripheral): Secondary | ICD-10-CM | POA: Diagnosis not present

## 2018-10-11 DIAGNOSIS — I872 Venous insufficiency (chronic) (peripheral): Secondary | ICD-10-CM | POA: Diagnosis not present

## 2018-10-11 DIAGNOSIS — R600 Localized edema: Secondary | ICD-10-CM | POA: Diagnosis not present

## 2018-10-12 DIAGNOSIS — E119 Type 2 diabetes mellitus without complications: Secondary | ICD-10-CM | POA: Diagnosis not present

## 2018-10-12 DIAGNOSIS — I872 Venous insufficiency (chronic) (peripheral): Secondary | ICD-10-CM | POA: Diagnosis not present

## 2018-10-12 DIAGNOSIS — Z7984 Long term (current) use of oral hypoglycemic drugs: Secondary | ICD-10-CM | POA: Diagnosis not present

## 2018-10-12 DIAGNOSIS — Z79899 Other long term (current) drug therapy: Secondary | ICD-10-CM | POA: Diagnosis not present

## 2018-10-13 DIAGNOSIS — L97909 Non-pressure chronic ulcer of unspecified part of unspecified lower leg with unspecified severity: Secondary | ICD-10-CM | POA: Diagnosis not present

## 2018-10-13 DIAGNOSIS — I872 Venous insufficiency (chronic) (peripheral): Secondary | ICD-10-CM | POA: Diagnosis not present

## 2018-10-13 DIAGNOSIS — R2240 Localized swelling, mass and lump, unspecified lower limb: Secondary | ICD-10-CM | POA: Diagnosis not present

## 2018-10-13 DIAGNOSIS — M79606 Pain in leg, unspecified: Secondary | ICD-10-CM | POA: Diagnosis not present

## 2018-10-14 DIAGNOSIS — Z7984 Long term (current) use of oral hypoglycemic drugs: Secondary | ICD-10-CM | POA: Diagnosis not present

## 2018-10-14 DIAGNOSIS — I872 Venous insufficiency (chronic) (peripheral): Secondary | ICD-10-CM | POA: Diagnosis not present

## 2018-10-14 DIAGNOSIS — E119 Type 2 diabetes mellitus without complications: Secondary | ICD-10-CM | POA: Diagnosis not present

## 2018-10-14 DIAGNOSIS — Z79899 Other long term (current) drug therapy: Secondary | ICD-10-CM | POA: Diagnosis not present

## 2018-10-17 DIAGNOSIS — I872 Venous insufficiency (chronic) (peripheral): Secondary | ICD-10-CM | POA: Diagnosis not present

## 2018-10-21 DIAGNOSIS — I890 Lymphedema, not elsewhere classified: Secondary | ICD-10-CM | POA: Diagnosis not present

## 2018-10-21 DIAGNOSIS — I872 Venous insufficiency (chronic) (peripheral): Secondary | ICD-10-CM | POA: Diagnosis not present

## 2018-12-13 DIAGNOSIS — E1165 Type 2 diabetes mellitus with hyperglycemia: Secondary | ICD-10-CM | POA: Diagnosis not present

## 2018-12-13 DIAGNOSIS — Z299 Encounter for prophylactic measures, unspecified: Secondary | ICD-10-CM | POA: Diagnosis not present

## 2018-12-13 DIAGNOSIS — I10 Essential (primary) hypertension: Secondary | ICD-10-CM | POA: Diagnosis not present

## 2018-12-13 DIAGNOSIS — Z6841 Body Mass Index (BMI) 40.0 and over, adult: Secondary | ICD-10-CM | POA: Diagnosis not present

## 2018-12-13 DIAGNOSIS — F419 Anxiety disorder, unspecified: Secondary | ICD-10-CM | POA: Diagnosis not present

## 2019-02-14 DIAGNOSIS — Z299 Encounter for prophylactic measures, unspecified: Secondary | ICD-10-CM | POA: Diagnosis not present

## 2019-02-14 DIAGNOSIS — Z6841 Body Mass Index (BMI) 40.0 and over, adult: Secondary | ICD-10-CM | POA: Diagnosis not present

## 2019-02-14 DIAGNOSIS — E1165 Type 2 diabetes mellitus with hyperglycemia: Secondary | ICD-10-CM | POA: Diagnosis not present

## 2019-02-14 DIAGNOSIS — I10 Essential (primary) hypertension: Secondary | ICD-10-CM | POA: Diagnosis not present

## 2019-08-17 DIAGNOSIS — R5383 Other fatigue: Secondary | ICD-10-CM | POA: Diagnosis not present

## 2019-08-17 DIAGNOSIS — Z0000 Encounter for general adult medical examination without abnormal findings: Secondary | ICD-10-CM | POA: Diagnosis not present

## 2019-08-17 DIAGNOSIS — Z6841 Body Mass Index (BMI) 40.0 and over, adult: Secondary | ICD-10-CM | POA: Diagnosis not present

## 2019-08-17 DIAGNOSIS — Z1339 Encounter for screening examination for other mental health and behavioral disorders: Secondary | ICD-10-CM | POA: Diagnosis not present

## 2019-08-17 DIAGNOSIS — Z299 Encounter for prophylactic measures, unspecified: Secondary | ICD-10-CM | POA: Diagnosis not present

## 2019-08-17 DIAGNOSIS — F419 Anxiety disorder, unspecified: Secondary | ICD-10-CM | POA: Diagnosis not present

## 2019-08-17 DIAGNOSIS — Z7189 Other specified counseling: Secondary | ICD-10-CM | POA: Diagnosis not present

## 2019-08-17 DIAGNOSIS — Z1331 Encounter for screening for depression: Secondary | ICD-10-CM | POA: Diagnosis not present

## 2019-08-17 DIAGNOSIS — Z1389 Encounter for screening for other disorder: Secondary | ICD-10-CM | POA: Diagnosis not present

## 2019-10-15 DIAGNOSIS — E1165 Type 2 diabetes mellitus with hyperglycemia: Secondary | ICD-10-CM | POA: Diagnosis not present

## 2019-10-15 DIAGNOSIS — F419 Anxiety disorder, unspecified: Secondary | ICD-10-CM | POA: Diagnosis not present

## 2020-02-14 DIAGNOSIS — I10 Essential (primary) hypertension: Secondary | ICD-10-CM | POA: Diagnosis not present

## 2020-02-14 DIAGNOSIS — E119 Type 2 diabetes mellitus without complications: Secondary | ICD-10-CM | POA: Diagnosis not present

## 2020-02-27 DIAGNOSIS — I6523 Occlusion and stenosis of bilateral carotid arteries: Secondary | ICD-10-CM | POA: Diagnosis not present

## 2020-02-27 DIAGNOSIS — I6622 Occlusion and stenosis of left posterior cerebral artery: Secondary | ICD-10-CM | POA: Diagnosis not present

## 2020-02-27 DIAGNOSIS — I639 Cerebral infarction, unspecified: Secondary | ICD-10-CM | POA: Diagnosis not present

## 2020-02-27 DIAGNOSIS — R404 Transient alteration of awareness: Secondary | ICD-10-CM | POA: Diagnosis not present

## 2020-02-27 DIAGNOSIS — I10 Essential (primary) hypertension: Secondary | ICD-10-CM | POA: Diagnosis not present

## 2020-02-27 DIAGNOSIS — E119 Type 2 diabetes mellitus without complications: Secondary | ICD-10-CM | POA: Diagnosis not present

## 2020-02-28 DIAGNOSIS — I63512 Cerebral infarction due to unspecified occlusion or stenosis of left middle cerebral artery: Secondary | ICD-10-CM | POA: Diagnosis not present

## 2020-02-28 DIAGNOSIS — I059 Rheumatic mitral valve disease, unspecified: Secondary | ICD-10-CM | POA: Diagnosis not present

## 2020-02-28 DIAGNOSIS — I517 Cardiomegaly: Secondary | ICD-10-CM | POA: Diagnosis not present

## 2020-02-28 DIAGNOSIS — E119 Type 2 diabetes mellitus without complications: Secondary | ICD-10-CM | POA: Diagnosis not present

## 2020-02-28 DIAGNOSIS — Z79899 Other long term (current) drug therapy: Secondary | ICD-10-CM | POA: Diagnosis not present

## 2020-02-28 DIAGNOSIS — E1165 Type 2 diabetes mellitus with hyperglycemia: Secondary | ICD-10-CM | POA: Diagnosis not present

## 2020-02-28 DIAGNOSIS — I161 Hypertensive emergency: Secondary | ICD-10-CM | POA: Diagnosis not present

## 2020-02-28 DIAGNOSIS — I639 Cerebral infarction, unspecified: Secondary | ICD-10-CM | POA: Diagnosis not present

## 2020-02-28 DIAGNOSIS — E782 Mixed hyperlipidemia: Secondary | ICD-10-CM | POA: Diagnosis not present

## 2020-02-28 DIAGNOSIS — E785 Hyperlipidemia, unspecified: Secondary | ICD-10-CM | POA: Diagnosis not present

## 2020-02-28 DIAGNOSIS — I6381 Other cerebral infarction due to occlusion or stenosis of small artery: Secondary | ICD-10-CM | POA: Diagnosis not present

## 2020-02-28 DIAGNOSIS — R21 Rash and other nonspecific skin eruption: Secondary | ICD-10-CM | POA: Diagnosis not present

## 2020-02-28 DIAGNOSIS — R29705 NIHSS score 5: Secondary | ICD-10-CM | POA: Diagnosis not present

## 2020-02-28 DIAGNOSIS — Z6841 Body Mass Index (BMI) 40.0 and over, adult: Secondary | ICD-10-CM | POA: Diagnosis not present

## 2020-02-28 DIAGNOSIS — R4701 Aphasia: Secondary | ICD-10-CM | POA: Diagnosis not present

## 2020-02-28 DIAGNOSIS — I340 Nonrheumatic mitral (valve) insufficiency: Secondary | ICD-10-CM | POA: Diagnosis not present

## 2020-02-28 DIAGNOSIS — D72829 Elevated white blood cell count, unspecified: Secondary | ICD-10-CM | POA: Diagnosis not present

## 2020-02-28 DIAGNOSIS — Z9282 Status post administration of tPA (rtPA) in a different facility within the last 24 hours prior to admission to current facility: Secondary | ICD-10-CM | POA: Diagnosis not present

## 2020-02-28 DIAGNOSIS — R451 Restlessness and agitation: Secondary | ICD-10-CM | POA: Diagnosis not present

## 2020-02-28 DIAGNOSIS — I6789 Other cerebrovascular disease: Secondary | ICD-10-CM | POA: Diagnosis not present

## 2020-02-28 DIAGNOSIS — E871 Hypo-osmolality and hyponatremia: Secondary | ICD-10-CM | POA: Diagnosis not present

## 2020-02-28 DIAGNOSIS — R4781 Slurred speech: Secondary | ICD-10-CM | POA: Diagnosis not present

## 2020-02-28 DIAGNOSIS — B372 Candidiasis of skin and nail: Secondary | ICD-10-CM | POA: Diagnosis not present

## 2020-02-28 DIAGNOSIS — I10 Essential (primary) hypertension: Secondary | ICD-10-CM | POA: Diagnosis not present

## 2020-02-28 DIAGNOSIS — G4700 Insomnia, unspecified: Secondary | ICD-10-CM | POA: Diagnosis not present

## 2020-02-28 DIAGNOSIS — E669 Obesity, unspecified: Secondary | ICD-10-CM | POA: Diagnosis not present

## 2020-03-06 DIAGNOSIS — E559 Vitamin D deficiency, unspecified: Secondary | ICD-10-CM | POA: Diagnosis not present

## 2020-03-06 DIAGNOSIS — E538 Deficiency of other specified B group vitamins: Secondary | ICD-10-CM | POA: Diagnosis not present

## 2020-03-06 DIAGNOSIS — I10 Essential (primary) hypertension: Secondary | ICD-10-CM | POA: Diagnosis not present

## 2020-03-06 DIAGNOSIS — I6381 Other cerebral infarction due to occlusion or stenosis of small artery: Secondary | ICD-10-CM | POA: Diagnosis not present

## 2020-03-06 DIAGNOSIS — Z9282 Status post administration of tPA (rtPA) in a different facility within the last 24 hours prior to admission to current facility: Secondary | ICD-10-CM | POA: Diagnosis not present

## 2020-03-06 DIAGNOSIS — Q211 Atrial septal defect: Secondary | ICD-10-CM | POA: Diagnosis not present

## 2020-03-15 DIAGNOSIS — I10 Essential (primary) hypertension: Secondary | ICD-10-CM | POA: Diagnosis not present

## 2020-03-15 DIAGNOSIS — E119 Type 2 diabetes mellitus without complications: Secondary | ICD-10-CM | POA: Diagnosis not present

## 2020-04-29 DIAGNOSIS — E1165 Type 2 diabetes mellitus with hyperglycemia: Secondary | ICD-10-CM | POA: Diagnosis not present

## 2020-04-29 DIAGNOSIS — Z299 Encounter for prophylactic measures, unspecified: Secondary | ICD-10-CM | POA: Diagnosis not present

## 2020-04-29 DIAGNOSIS — I10 Essential (primary) hypertension: Secondary | ICD-10-CM | POA: Diagnosis not present

## 2020-05-16 DIAGNOSIS — I10 Essential (primary) hypertension: Secondary | ICD-10-CM | POA: Diagnosis not present

## 2020-05-16 DIAGNOSIS — E119 Type 2 diabetes mellitus without complications: Secondary | ICD-10-CM | POA: Diagnosis not present

## 2020-05-29 DIAGNOSIS — Z299 Encounter for prophylactic measures, unspecified: Secondary | ICD-10-CM | POA: Diagnosis not present

## 2020-05-29 DIAGNOSIS — I10 Essential (primary) hypertension: Secondary | ICD-10-CM | POA: Diagnosis not present

## 2020-05-29 DIAGNOSIS — E1165 Type 2 diabetes mellitus with hyperglycemia: Secondary | ICD-10-CM | POA: Diagnosis not present

## 2020-07-16 DIAGNOSIS — I10 Essential (primary) hypertension: Secondary | ICD-10-CM | POA: Diagnosis not present

## 2020-07-16 DIAGNOSIS — F419 Anxiety disorder, unspecified: Secondary | ICD-10-CM | POA: Diagnosis not present

## 2020-07-19 ENCOUNTER — Other Ambulatory Visit: Payer: Self-pay | Admitting: Internal Medicine

## 2020-07-19 DIAGNOSIS — Z1231 Encounter for screening mammogram for malignant neoplasm of breast: Secondary | ICD-10-CM

## 2020-08-16 DIAGNOSIS — I10 Essential (primary) hypertension: Secondary | ICD-10-CM | POA: Diagnosis not present

## 2020-08-16 DIAGNOSIS — F419 Anxiety disorder, unspecified: Secondary | ICD-10-CM | POA: Diagnosis not present

## 2020-09-16 DIAGNOSIS — I10 Essential (primary) hypertension: Secondary | ICD-10-CM | POA: Diagnosis not present

## 2020-09-16 DIAGNOSIS — F419 Anxiety disorder, unspecified: Secondary | ICD-10-CM | POA: Diagnosis not present

## 2020-10-27 DIAGNOSIS — I10 Essential (primary) hypertension: Secondary | ICD-10-CM | POA: Diagnosis not present

## 2020-10-27 DIAGNOSIS — G459 Transient cerebral ischemic attack, unspecified: Secondary | ICD-10-CM | POA: Diagnosis not present

## 2020-10-27 DIAGNOSIS — Z7984 Long term (current) use of oral hypoglycemic drugs: Secondary | ICD-10-CM | POA: Diagnosis not present

## 2020-10-27 DIAGNOSIS — E119 Type 2 diabetes mellitus without complications: Secondary | ICD-10-CM | POA: Diagnosis not present

## 2020-10-27 DIAGNOSIS — K0889 Other specified disorders of teeth and supporting structures: Secondary | ICD-10-CM | POA: Diagnosis not present

## 2020-10-27 DIAGNOSIS — I6381 Other cerebral infarction due to occlusion or stenosis of small artery: Secondary | ICD-10-CM | POA: Diagnosis not present

## 2020-10-27 DIAGNOSIS — Z79899 Other long term (current) drug therapy: Secondary | ICD-10-CM | POA: Diagnosis not present

## 2020-10-27 DIAGNOSIS — R202 Paresthesia of skin: Secondary | ICD-10-CM | POA: Diagnosis not present

## 2020-10-27 DIAGNOSIS — Z7982 Long term (current) use of aspirin: Secondary | ICD-10-CM | POA: Diagnosis not present

## 2020-10-27 DIAGNOSIS — R0689 Other abnormalities of breathing: Secondary | ICD-10-CM | POA: Diagnosis not present

## 2020-10-27 DIAGNOSIS — I639 Cerebral infarction, unspecified: Secondary | ICD-10-CM | POA: Diagnosis not present

## 2020-10-27 DIAGNOSIS — R9402 Abnormal brain scan: Secondary | ICD-10-CM | POA: Diagnosis not present

## 2020-10-27 DIAGNOSIS — Z20822 Contact with and (suspected) exposure to covid-19: Secondary | ICD-10-CM | POA: Diagnosis not present

## 2020-10-27 DIAGNOSIS — G8194 Hemiplegia, unspecified affecting left nondominant side: Secondary | ICD-10-CM | POA: Diagnosis not present

## 2020-10-27 DIAGNOSIS — R29701 NIHSS score 1: Secondary | ICD-10-CM | POA: Diagnosis not present

## 2020-10-27 DIAGNOSIS — Z6841 Body Mass Index (BMI) 40.0 and over, adult: Secondary | ICD-10-CM | POA: Diagnosis not present

## 2020-10-27 DIAGNOSIS — E669 Obesity, unspecified: Secondary | ICD-10-CM | POA: Diagnosis not present

## 2020-10-27 DIAGNOSIS — K047 Periapical abscess without sinus: Secondary | ICD-10-CM | POA: Diagnosis not present

## 2020-10-27 DIAGNOSIS — R457 State of emotional shock and stress, unspecified: Secondary | ICD-10-CM | POA: Diagnosis not present

## 2020-10-27 DIAGNOSIS — R29810 Facial weakness: Secondary | ICD-10-CM | POA: Diagnosis not present

## 2020-10-27 DIAGNOSIS — Z794 Long term (current) use of insulin: Secondary | ICD-10-CM | POA: Diagnosis not present

## 2020-11-05 DIAGNOSIS — I10 Essential (primary) hypertension: Secondary | ICD-10-CM | POA: Diagnosis not present

## 2020-11-05 DIAGNOSIS — I83009 Varicose veins of unspecified lower extremity with ulcer of unspecified site: Secondary | ICD-10-CM | POA: Diagnosis not present

## 2020-11-05 DIAGNOSIS — E1165 Type 2 diabetes mellitus with hyperglycemia: Secondary | ICD-10-CM | POA: Diagnosis not present

## 2020-11-05 DIAGNOSIS — G459 Transient cerebral ischemic attack, unspecified: Secondary | ICD-10-CM | POA: Diagnosis not present

## 2020-11-05 DIAGNOSIS — Z299 Encounter for prophylactic measures, unspecified: Secondary | ICD-10-CM | POA: Diagnosis not present

## 2020-11-10 DIAGNOSIS — Z794 Long term (current) use of insulin: Secondary | ICD-10-CM | POA: Diagnosis not present

## 2020-11-10 DIAGNOSIS — Z7984 Long term (current) use of oral hypoglycemic drugs: Secondary | ICD-10-CM | POA: Diagnosis not present

## 2020-11-10 DIAGNOSIS — R112 Nausea with vomiting, unspecified: Secondary | ICD-10-CM | POA: Diagnosis not present

## 2020-11-10 DIAGNOSIS — E119 Type 2 diabetes mellitus without complications: Secondary | ICD-10-CM | POA: Diagnosis not present

## 2020-11-10 DIAGNOSIS — E7800 Pure hypercholesterolemia, unspecified: Secondary | ICD-10-CM | POA: Diagnosis not present

## 2020-11-10 DIAGNOSIS — Z7982 Long term (current) use of aspirin: Secondary | ICD-10-CM | POA: Diagnosis not present

## 2020-11-13 DIAGNOSIS — I10 Essential (primary) hypertension: Secondary | ICD-10-CM | POA: Diagnosis not present

## 2020-12-13 DIAGNOSIS — R5383 Other fatigue: Secondary | ICD-10-CM | POA: Diagnosis not present

## 2020-12-13 DIAGNOSIS — Z7189 Other specified counseling: Secondary | ICD-10-CM | POA: Diagnosis not present

## 2020-12-13 DIAGNOSIS — Z1339 Encounter for screening examination for other mental health and behavioral disorders: Secondary | ICD-10-CM | POA: Diagnosis not present

## 2020-12-13 DIAGNOSIS — E785 Hyperlipidemia, unspecified: Secondary | ICD-10-CM | POA: Diagnosis not present

## 2020-12-13 DIAGNOSIS — I10 Essential (primary) hypertension: Secondary | ICD-10-CM | POA: Diagnosis not present

## 2020-12-13 DIAGNOSIS — R32 Unspecified urinary incontinence: Secondary | ICD-10-CM | POA: Diagnosis not present

## 2020-12-13 DIAGNOSIS — Z0000 Encounter for general adult medical examination without abnormal findings: Secondary | ICD-10-CM | POA: Diagnosis not present

## 2020-12-13 DIAGNOSIS — Z1331 Encounter for screening for depression: Secondary | ICD-10-CM | POA: Diagnosis not present

## 2020-12-13 DIAGNOSIS — Z79899 Other long term (current) drug therapy: Secondary | ICD-10-CM | POA: Diagnosis not present

## 2020-12-13 DIAGNOSIS — Z6841 Body Mass Index (BMI) 40.0 and over, adult: Secondary | ICD-10-CM | POA: Diagnosis not present

## 2020-12-13 DIAGNOSIS — Z299 Encounter for prophylactic measures, unspecified: Secondary | ICD-10-CM | POA: Diagnosis not present

## 2021-01-13 DIAGNOSIS — E7849 Other hyperlipidemia: Secondary | ICD-10-CM | POA: Diagnosis not present

## 2021-01-13 DIAGNOSIS — F329 Major depressive disorder, single episode, unspecified: Secondary | ICD-10-CM | POA: Diagnosis not present

## 2021-01-13 DIAGNOSIS — I10 Essential (primary) hypertension: Secondary | ICD-10-CM | POA: Diagnosis not present

## 2021-02-13 DIAGNOSIS — I10 Essential (primary) hypertension: Secondary | ICD-10-CM | POA: Diagnosis not present

## 2021-02-13 DIAGNOSIS — F329 Major depressive disorder, single episode, unspecified: Secondary | ICD-10-CM | POA: Diagnosis not present

## 2021-02-13 DIAGNOSIS — E7849 Other hyperlipidemia: Secondary | ICD-10-CM | POA: Diagnosis not present

## 2021-05-16 DIAGNOSIS — E7800 Pure hypercholesterolemia, unspecified: Secondary | ICD-10-CM | POA: Diagnosis not present

## 2021-05-16 DIAGNOSIS — I10 Essential (primary) hypertension: Secondary | ICD-10-CM | POA: Diagnosis not present

## 2021-07-02 DIAGNOSIS — R531 Weakness: Secondary | ICD-10-CM | POA: Diagnosis not present

## 2021-07-02 DIAGNOSIS — R5381 Other malaise: Secondary | ICD-10-CM | POA: Diagnosis not present

## 2021-07-16 DIAGNOSIS — F419 Anxiety disorder, unspecified: Secondary | ICD-10-CM | POA: Diagnosis not present

## 2021-07-16 DIAGNOSIS — E119 Type 2 diabetes mellitus without complications: Secondary | ICD-10-CM | POA: Diagnosis not present

## 2021-07-16 DIAGNOSIS — I10 Essential (primary) hypertension: Secondary | ICD-10-CM | POA: Diagnosis not present

## 2021-07-21 ENCOUNTER — Other Ambulatory Visit: Payer: Self-pay | Admitting: Internal Medicine

## 2021-07-21 DIAGNOSIS — Z1231 Encounter for screening mammogram for malignant neoplasm of breast: Secondary | ICD-10-CM

## 2021-09-16 DIAGNOSIS — F419 Anxiety disorder, unspecified: Secondary | ICD-10-CM | POA: Diagnosis not present

## 2021-09-16 DIAGNOSIS — E119 Type 2 diabetes mellitus without complications: Secondary | ICD-10-CM | POA: Diagnosis not present

## 2021-09-16 DIAGNOSIS — I10 Essential (primary) hypertension: Secondary | ICD-10-CM | POA: Diagnosis not present

## 2021-09-24 ENCOUNTER — Ambulatory Visit
Admission: RE | Admit: 2021-09-24 | Discharge: 2021-09-24 | Disposition: A | Discharge status: Home / Self Care | Payer: PPO | Source: Ambulatory Visit | Attending: Internal Medicine | Admitting: Internal Medicine

## 2021-09-24 DIAGNOSIS — Z1231 Encounter for screening mammogram for malignant neoplasm of breast: Secondary | ICD-10-CM

## 2021-09-26 ENCOUNTER — Other Ambulatory Visit: Payer: Self-pay | Admitting: Internal Medicine

## 2021-09-26 DIAGNOSIS — R928 Other abnormal and inconclusive findings on diagnostic imaging of breast: Secondary | ICD-10-CM

## 2021-10-03 DIAGNOSIS — E1165 Type 2 diabetes mellitus with hyperglycemia: Secondary | ICD-10-CM | POA: Diagnosis not present

## 2021-10-03 DIAGNOSIS — Z299 Encounter for prophylactic measures, unspecified: Secondary | ICD-10-CM | POA: Diagnosis not present

## 2021-10-03 DIAGNOSIS — I10 Essential (primary) hypertension: Secondary | ICD-10-CM | POA: Diagnosis not present

## 2021-10-03 DIAGNOSIS — E785 Hyperlipidemia, unspecified: Secondary | ICD-10-CM | POA: Diagnosis not present

## 2021-10-14 DIAGNOSIS — I10 Essential (primary) hypertension: Secondary | ICD-10-CM | POA: Diagnosis not present

## 2021-11-13 DIAGNOSIS — I10 Essential (primary) hypertension: Secondary | ICD-10-CM | POA: Diagnosis not present

## 2021-11-13 DIAGNOSIS — E7849 Other hyperlipidemia: Secondary | ICD-10-CM | POA: Diagnosis not present

## 2021-11-13 DIAGNOSIS — F329 Major depressive disorder, single episode, unspecified: Secondary | ICD-10-CM | POA: Diagnosis not present

## 2021-12-14 DIAGNOSIS — I10 Essential (primary) hypertension: Secondary | ICD-10-CM | POA: Diagnosis not present

## 2021-12-17 DIAGNOSIS — N6325 Unspecified lump in the left breast, overlapping quadrants: Secondary | ICD-10-CM | POA: Diagnosis not present

## 2021-12-17 DIAGNOSIS — R590 Localized enlarged lymph nodes: Secondary | ICD-10-CM | POA: Diagnosis not present

## 2021-12-17 DIAGNOSIS — R921 Mammographic calcification found on diagnostic imaging of breast: Secondary | ICD-10-CM | POA: Diagnosis not present

## 2021-12-18 DIAGNOSIS — L97911 Non-pressure chronic ulcer of unspecified part of right lower leg limited to breakdown of skin: Secondary | ICD-10-CM | POA: Diagnosis not present

## 2021-12-18 DIAGNOSIS — Z299 Encounter for prophylactic measures, unspecified: Secondary | ICD-10-CM | POA: Diagnosis not present

## 2021-12-18 DIAGNOSIS — I10 Essential (primary) hypertension: Secondary | ICD-10-CM | POA: Diagnosis not present

## 2021-12-18 DIAGNOSIS — Z789 Other specified health status: Secondary | ICD-10-CM | POA: Diagnosis not present

## 2021-12-18 DIAGNOSIS — I83009 Varicose veins of unspecified lower extremity with ulcer of unspecified site: Secondary | ICD-10-CM | POA: Diagnosis not present

## 2021-12-18 DIAGNOSIS — Z6838 Body mass index (BMI) 38.0-38.9, adult: Secondary | ICD-10-CM | POA: Diagnosis not present

## 2021-12-19 DIAGNOSIS — N6325 Unspecified lump in the left breast, overlapping quadrants: Secondary | ICD-10-CM | POA: Diagnosis not present

## 2021-12-19 DIAGNOSIS — C773 Secondary and unspecified malignant neoplasm of axilla and upper limb lymph nodes: Secondary | ICD-10-CM | POA: Diagnosis not present

## 2021-12-19 DIAGNOSIS — Z170 Estrogen receptor positive status [ER+]: Secondary | ICD-10-CM | POA: Diagnosis not present

## 2021-12-19 DIAGNOSIS — R590 Localized enlarged lymph nodes: Secondary | ICD-10-CM | POA: Diagnosis not present

## 2021-12-19 DIAGNOSIS — C50812 Malignant neoplasm of overlapping sites of left female breast: Secondary | ICD-10-CM | POA: Diagnosis not present

## 2021-12-23 DIAGNOSIS — Z7984 Long term (current) use of oral hypoglycemic drugs: Secondary | ICD-10-CM | POA: Diagnosis not present

## 2021-12-23 DIAGNOSIS — E1165 Type 2 diabetes mellitus with hyperglycemia: Secondary | ICD-10-CM | POA: Diagnosis not present

## 2021-12-23 DIAGNOSIS — E1159 Type 2 diabetes mellitus with other circulatory complications: Secondary | ICD-10-CM | POA: Diagnosis not present

## 2021-12-23 DIAGNOSIS — Z6838 Body mass index (BMI) 38.0-38.9, adult: Secondary | ICD-10-CM | POA: Diagnosis not present

## 2021-12-23 DIAGNOSIS — I10 Essential (primary) hypertension: Secondary | ICD-10-CM | POA: Diagnosis not present

## 2021-12-23 DIAGNOSIS — R2243 Localized swelling, mass and lump, lower limb, bilateral: Secondary | ICD-10-CM | POA: Diagnosis not present

## 2021-12-23 DIAGNOSIS — Z794 Long term (current) use of insulin: Secondary | ICD-10-CM | POA: Diagnosis not present

## 2021-12-23 DIAGNOSIS — L97919 Non-pressure chronic ulcer of unspecified part of right lower leg with unspecified severity: Secondary | ICD-10-CM | POA: Diagnosis not present

## 2021-12-23 DIAGNOSIS — I872 Venous insufficiency (chronic) (peripheral): Secondary | ICD-10-CM | POA: Diagnosis not present

## 2021-12-26 DIAGNOSIS — Z299 Encounter for prophylactic measures, unspecified: Secondary | ICD-10-CM | POA: Diagnosis not present

## 2021-12-26 DIAGNOSIS — I10 Essential (primary) hypertension: Secondary | ICD-10-CM | POA: Diagnosis not present

## 2021-12-26 DIAGNOSIS — C50912 Malignant neoplasm of unspecified site of left female breast: Secondary | ICD-10-CM | POA: Diagnosis not present

## 2021-12-27 DIAGNOSIS — I872 Venous insufficiency (chronic) (peripheral): Secondary | ICD-10-CM | POA: Diagnosis not present

## 2022-01-05 DIAGNOSIS — Z170 Estrogen receptor positive status [ER+]: Secondary | ICD-10-CM | POA: Diagnosis not present

## 2022-01-05 DIAGNOSIS — C50912 Malignant neoplasm of unspecified site of left female breast: Secondary | ICD-10-CM | POA: Diagnosis not present

## 2022-01-08 DIAGNOSIS — R2243 Localized swelling, mass and lump, lower limb, bilateral: Secondary | ICD-10-CM | POA: Diagnosis not present

## 2022-01-08 DIAGNOSIS — I872 Venous insufficiency (chronic) (peripheral): Secondary | ICD-10-CM | POA: Diagnosis not present

## 2022-01-08 DIAGNOSIS — L97919 Non-pressure chronic ulcer of unspecified part of right lower leg with unspecified severity: Secondary | ICD-10-CM | POA: Diagnosis not present

## 2022-01-08 DIAGNOSIS — E1165 Type 2 diabetes mellitus with hyperglycemia: Secondary | ICD-10-CM | POA: Diagnosis not present

## 2022-01-13 DIAGNOSIS — I10 Essential (primary) hypertension: Secondary | ICD-10-CM | POA: Diagnosis not present

## 2022-01-14 DIAGNOSIS — Z170 Estrogen receptor positive status [ER+]: Secondary | ICD-10-CM | POA: Diagnosis not present

## 2022-01-14 DIAGNOSIS — C50912 Malignant neoplasm of unspecified site of left female breast: Secondary | ICD-10-CM | POA: Diagnosis not present

## 2022-01-19 DIAGNOSIS — I83012 Varicose veins of right lower extremity with ulcer of calf: Secondary | ICD-10-CM | POA: Diagnosis not present

## 2022-01-19 DIAGNOSIS — Z170 Estrogen receptor positive status [ER+]: Secondary | ICD-10-CM | POA: Diagnosis not present

## 2022-01-19 DIAGNOSIS — C50212 Malignant neoplasm of upper-inner quadrant of left female breast: Secondary | ICD-10-CM | POA: Diagnosis not present

## 2022-01-19 DIAGNOSIS — L97212 Non-pressure chronic ulcer of right calf with fat layer exposed: Secondary | ICD-10-CM | POA: Diagnosis not present

## 2022-01-20 DIAGNOSIS — Z7984 Long term (current) use of oral hypoglycemic drugs: Secondary | ICD-10-CM | POA: Diagnosis not present

## 2022-01-20 DIAGNOSIS — Z6838 Body mass index (BMI) 38.0-38.9, adult: Secondary | ICD-10-CM | POA: Diagnosis not present

## 2022-01-20 DIAGNOSIS — E1159 Type 2 diabetes mellitus with other circulatory complications: Secondary | ICD-10-CM | POA: Diagnosis not present

## 2022-01-20 DIAGNOSIS — I10 Essential (primary) hypertension: Secondary | ICD-10-CM | POA: Diagnosis not present

## 2022-01-20 DIAGNOSIS — L97919 Non-pressure chronic ulcer of unspecified part of right lower leg with unspecified severity: Secondary | ICD-10-CM | POA: Diagnosis not present

## 2022-01-20 DIAGNOSIS — E1165 Type 2 diabetes mellitus with hyperglycemia: Secondary | ICD-10-CM | POA: Diagnosis not present

## 2022-01-20 DIAGNOSIS — Z794 Long term (current) use of insulin: Secondary | ICD-10-CM | POA: Diagnosis not present

## 2022-01-20 DIAGNOSIS — I872 Venous insufficiency (chronic) (peripheral): Secondary | ICD-10-CM | POA: Diagnosis not present

## 2022-01-20 DIAGNOSIS — R2243 Localized swelling, mass and lump, lower limb, bilateral: Secondary | ICD-10-CM | POA: Diagnosis not present

## 2022-01-23 DIAGNOSIS — L97212 Non-pressure chronic ulcer of right calf with fat layer exposed: Secondary | ICD-10-CM | POA: Diagnosis not present

## 2022-01-23 DIAGNOSIS — I10 Essential (primary) hypertension: Secondary | ICD-10-CM | POA: Diagnosis not present

## 2022-01-23 DIAGNOSIS — I83012 Varicose veins of right lower extremity with ulcer of calf: Secondary | ICD-10-CM | POA: Diagnosis not present

## 2022-01-23 DIAGNOSIS — D649 Anemia, unspecified: Secondary | ICD-10-CM | POA: Diagnosis not present

## 2022-01-23 DIAGNOSIS — E119 Type 2 diabetes mellitus without complications: Secondary | ICD-10-CM | POA: Diagnosis not present

## 2022-01-23 DIAGNOSIS — Z7984 Long term (current) use of oral hypoglycemic drugs: Secondary | ICD-10-CM | POA: Diagnosis not present

## 2022-01-23 DIAGNOSIS — Z79899 Other long term (current) drug therapy: Secondary | ICD-10-CM | POA: Diagnosis not present

## 2022-01-23 DIAGNOSIS — Z91148 Patient's other noncompliance with medication regimen for other reason: Secondary | ICD-10-CM | POA: Diagnosis not present

## 2022-01-23 DIAGNOSIS — R231 Pallor: Secondary | ICD-10-CM | POA: Diagnosis not present

## 2022-01-23 DIAGNOSIS — E11622 Type 2 diabetes mellitus with other skin ulcer: Secondary | ICD-10-CM | POA: Diagnosis not present

## 2022-01-23 DIAGNOSIS — Z794 Long term (current) use of insulin: Secondary | ICD-10-CM | POA: Diagnosis not present

## 2022-01-23 DIAGNOSIS — Z170 Estrogen receptor positive status [ER+]: Secondary | ICD-10-CM | POA: Diagnosis not present

## 2022-01-23 DIAGNOSIS — I872 Venous insufficiency (chronic) (peripheral): Secondary | ICD-10-CM | POA: Diagnosis not present

## 2022-01-23 DIAGNOSIS — C50912 Malignant neoplasm of unspecified site of left female breast: Secondary | ICD-10-CM | POA: Diagnosis not present

## 2022-01-23 DIAGNOSIS — Z880 Allergy status to penicillin: Secondary | ICD-10-CM | POA: Diagnosis not present

## 2022-01-23 DIAGNOSIS — Z853 Personal history of malignant neoplasm of breast: Secondary | ICD-10-CM | POA: Diagnosis not present

## 2022-01-23 DIAGNOSIS — Z7982 Long term (current) use of aspirin: Secondary | ICD-10-CM | POA: Diagnosis not present

## 2022-01-23 DIAGNOSIS — Z79811 Long term (current) use of aromatase inhibitors: Secondary | ICD-10-CM | POA: Diagnosis not present

## 2022-01-23 DIAGNOSIS — E7800 Pure hypercholesterolemia, unspecified: Secondary | ICD-10-CM | POA: Diagnosis not present

## 2022-01-26 DIAGNOSIS — I872 Venous insufficiency (chronic) (peripheral): Secondary | ICD-10-CM | POA: Diagnosis not present

## 2022-01-27 DIAGNOSIS — I872 Venous insufficiency (chronic) (peripheral): Secondary | ICD-10-CM | POA: Diagnosis not present

## 2022-02-03 DIAGNOSIS — I872 Venous insufficiency (chronic) (peripheral): Secondary | ICD-10-CM | POA: Diagnosis not present

## 2022-02-06 DIAGNOSIS — I872 Venous insufficiency (chronic) (peripheral): Secondary | ICD-10-CM | POA: Diagnosis not present

## 2022-02-10 DIAGNOSIS — Z299 Encounter for prophylactic measures, unspecified: Secondary | ICD-10-CM | POA: Diagnosis not present

## 2022-02-10 DIAGNOSIS — E1165 Type 2 diabetes mellitus with hyperglycemia: Secondary | ICD-10-CM | POA: Diagnosis not present

## 2022-02-10 DIAGNOSIS — D649 Anemia, unspecified: Secondary | ICD-10-CM | POA: Diagnosis not present

## 2022-02-10 DIAGNOSIS — I10 Essential (primary) hypertension: Secondary | ICD-10-CM | POA: Diagnosis not present

## 2022-02-10 DIAGNOSIS — Z789 Other specified health status: Secondary | ICD-10-CM | POA: Diagnosis not present

## 2022-02-10 DIAGNOSIS — C50912 Malignant neoplasm of unspecified site of left female breast: Secondary | ICD-10-CM | POA: Diagnosis not present

## 2022-02-11 DIAGNOSIS — R600 Localized edema: Secondary | ICD-10-CM | POA: Diagnosis not present

## 2022-02-11 DIAGNOSIS — M7121 Synovial cyst of popliteal space [Baker], right knee: Secondary | ICD-10-CM | POA: Diagnosis not present

## 2022-02-11 DIAGNOSIS — M79605 Pain in left leg: Secondary | ICD-10-CM | POA: Diagnosis not present

## 2022-02-11 DIAGNOSIS — M25461 Effusion, right knee: Secondary | ICD-10-CM | POA: Diagnosis not present

## 2022-02-11 DIAGNOSIS — I83012 Varicose veins of right lower extremity with ulcer of calf: Secondary | ICD-10-CM | POA: Diagnosis not present

## 2022-02-11 DIAGNOSIS — L97212 Non-pressure chronic ulcer of right calf with fat layer exposed: Secondary | ICD-10-CM | POA: Diagnosis not present

## 2022-02-12 DIAGNOSIS — L97929 Non-pressure chronic ulcer of unspecified part of left lower leg with unspecified severity: Secondary | ICD-10-CM | POA: Diagnosis not present

## 2022-02-12 DIAGNOSIS — I10 Essential (primary) hypertension: Secondary | ICD-10-CM | POA: Diagnosis not present

## 2022-02-12 DIAGNOSIS — L97212 Non-pressure chronic ulcer of right calf with fat layer exposed: Secondary | ICD-10-CM | POA: Diagnosis not present

## 2022-02-12 DIAGNOSIS — I83012 Varicose veins of right lower extremity with ulcer of calf: Secondary | ICD-10-CM | POA: Diagnosis not present

## 2022-02-13 DIAGNOSIS — I83012 Varicose veins of right lower extremity with ulcer of calf: Secondary | ICD-10-CM | POA: Diagnosis not present

## 2022-02-13 DIAGNOSIS — L97212 Non-pressure chronic ulcer of right calf with fat layer exposed: Secondary | ICD-10-CM | POA: Diagnosis not present

## 2022-02-16 DIAGNOSIS — E1165 Type 2 diabetes mellitus with hyperglycemia: Secondary | ICD-10-CM | POA: Diagnosis not present

## 2022-02-16 DIAGNOSIS — Z794 Long term (current) use of insulin: Secondary | ICD-10-CM | POA: Diagnosis not present

## 2022-02-16 DIAGNOSIS — L97919 Non-pressure chronic ulcer of unspecified part of right lower leg with unspecified severity: Secondary | ICD-10-CM | POA: Diagnosis not present

## 2022-02-16 DIAGNOSIS — I10 Essential (primary) hypertension: Secondary | ICD-10-CM | POA: Diagnosis not present

## 2022-02-16 DIAGNOSIS — E1159 Type 2 diabetes mellitus with other circulatory complications: Secondary | ICD-10-CM | POA: Diagnosis not present

## 2022-02-16 DIAGNOSIS — I872 Venous insufficiency (chronic) (peripheral): Secondary | ICD-10-CM | POA: Diagnosis not present

## 2022-02-16 DIAGNOSIS — R2243 Localized swelling, mass and lump, lower limb, bilateral: Secondary | ICD-10-CM | POA: Diagnosis not present

## 2022-02-16 DIAGNOSIS — Z6838 Body mass index (BMI) 38.0-38.9, adult: Secondary | ICD-10-CM | POA: Diagnosis not present

## 2022-02-16 DIAGNOSIS — Z7984 Long term (current) use of oral hypoglycemic drugs: Secondary | ICD-10-CM | POA: Diagnosis not present

## 2022-02-19 DIAGNOSIS — I872 Venous insufficiency (chronic) (peripheral): Secondary | ICD-10-CM | POA: Diagnosis not present

## 2022-02-27 DIAGNOSIS — E7800 Pure hypercholesterolemia, unspecified: Secondary | ICD-10-CM | POA: Diagnosis not present

## 2022-02-27 DIAGNOSIS — I10 Essential (primary) hypertension: Secondary | ICD-10-CM | POA: Diagnosis not present

## 2022-02-27 DIAGNOSIS — L97212 Non-pressure chronic ulcer of right calf with fat layer exposed: Secondary | ICD-10-CM | POA: Diagnosis not present

## 2022-02-27 DIAGNOSIS — I83012 Varicose veins of right lower extremity with ulcer of calf: Secondary | ICD-10-CM | POA: Diagnosis not present

## 2022-02-27 DIAGNOSIS — I872 Venous insufficiency (chronic) (peripheral): Secondary | ICD-10-CM | POA: Diagnosis not present

## 2022-02-27 DIAGNOSIS — E11621 Type 2 diabetes mellitus with foot ulcer: Secondary | ICD-10-CM | POA: Diagnosis not present

## 2022-02-27 DIAGNOSIS — Z7982 Long term (current) use of aspirin: Secondary | ICD-10-CM | POA: Diagnosis not present

## 2022-02-27 DIAGNOSIS — Z794 Long term (current) use of insulin: Secondary | ICD-10-CM | POA: Diagnosis not present

## 2022-03-06 DIAGNOSIS — I872 Venous insufficiency (chronic) (peripheral): Secondary | ICD-10-CM | POA: Diagnosis not present

## 2022-03-06 DIAGNOSIS — L97212 Non-pressure chronic ulcer of right calf with fat layer exposed: Secondary | ICD-10-CM | POA: Diagnosis not present

## 2022-03-09 DIAGNOSIS — C50912 Malignant neoplasm of unspecified site of left female breast: Secondary | ICD-10-CM | POA: Diagnosis not present

## 2022-03-09 DIAGNOSIS — Z170 Estrogen receptor positive status [ER+]: Secondary | ICD-10-CM | POA: Diagnosis not present

## 2022-03-09 DIAGNOSIS — C50212 Malignant neoplasm of upper-inner quadrant of left female breast: Secondary | ICD-10-CM | POA: Diagnosis not present

## 2022-03-13 DIAGNOSIS — L97212 Non-pressure chronic ulcer of right calf with fat layer exposed: Secondary | ICD-10-CM | POA: Diagnosis not present

## 2022-03-13 DIAGNOSIS — I872 Venous insufficiency (chronic) (peripheral): Secondary | ICD-10-CM | POA: Diagnosis not present

## 2022-03-16 DIAGNOSIS — I10 Essential (primary) hypertension: Secondary | ICD-10-CM | POA: Diagnosis not present

## 2022-03-17 DIAGNOSIS — Z6838 Body mass index (BMI) 38.0-38.9, adult: Secondary | ICD-10-CM | POA: Diagnosis not present

## 2022-03-17 DIAGNOSIS — E1165 Type 2 diabetes mellitus with hyperglycemia: Secondary | ICD-10-CM | POA: Diagnosis not present

## 2022-03-17 DIAGNOSIS — E1159 Type 2 diabetes mellitus with other circulatory complications: Secondary | ICD-10-CM | POA: Diagnosis not present

## 2022-03-17 DIAGNOSIS — L97919 Non-pressure chronic ulcer of unspecified part of right lower leg with unspecified severity: Secondary | ICD-10-CM | POA: Diagnosis not present

## 2022-03-17 DIAGNOSIS — Z7984 Long term (current) use of oral hypoglycemic drugs: Secondary | ICD-10-CM | POA: Diagnosis not present

## 2022-03-17 DIAGNOSIS — I872 Venous insufficiency (chronic) (peripheral): Secondary | ICD-10-CM | POA: Diagnosis not present

## 2022-03-17 DIAGNOSIS — Z794 Long term (current) use of insulin: Secondary | ICD-10-CM | POA: Diagnosis not present

## 2022-03-17 DIAGNOSIS — I10 Essential (primary) hypertension: Secondary | ICD-10-CM | POA: Diagnosis not present

## 2022-03-17 DIAGNOSIS — R2243 Localized swelling, mass and lump, lower limb, bilateral: Secondary | ICD-10-CM | POA: Diagnosis not present

## 2022-03-23 DIAGNOSIS — E119 Type 2 diabetes mellitus without complications: Secondary | ICD-10-CM | POA: Diagnosis not present

## 2022-04-10 DIAGNOSIS — Z7982 Long term (current) use of aspirin: Secondary | ICD-10-CM | POA: Diagnosis not present

## 2022-04-10 DIAGNOSIS — E11621 Type 2 diabetes mellitus with foot ulcer: Secondary | ICD-10-CM | POA: Diagnosis not present

## 2022-04-10 DIAGNOSIS — I872 Venous insufficiency (chronic) (peripheral): Secondary | ICD-10-CM | POA: Diagnosis not present

## 2022-04-10 DIAGNOSIS — I10 Essential (primary) hypertension: Secondary | ICD-10-CM | POA: Diagnosis not present

## 2022-04-10 DIAGNOSIS — L97212 Non-pressure chronic ulcer of right calf with fat layer exposed: Secondary | ICD-10-CM | POA: Diagnosis not present

## 2022-04-10 DIAGNOSIS — I83012 Varicose veins of right lower extremity with ulcer of calf: Secondary | ICD-10-CM | POA: Diagnosis not present

## 2022-04-10 DIAGNOSIS — E1159 Type 2 diabetes mellitus with other circulatory complications: Secondary | ICD-10-CM | POA: Diagnosis not present

## 2022-04-10 DIAGNOSIS — Z880 Allergy status to penicillin: Secondary | ICD-10-CM | POA: Diagnosis not present

## 2022-04-10 DIAGNOSIS — Z794 Long term (current) use of insulin: Secondary | ICD-10-CM | POA: Diagnosis not present

## 2022-04-10 DIAGNOSIS — E7800 Pure hypercholesterolemia, unspecified: Secondary | ICD-10-CM | POA: Diagnosis not present

## 2022-04-15 DIAGNOSIS — I10 Essential (primary) hypertension: Secondary | ICD-10-CM | POA: Diagnosis not present

## 2022-04-16 DIAGNOSIS — I10 Essential (primary) hypertension: Secondary | ICD-10-CM | POA: Diagnosis not present

## 2022-04-16 DIAGNOSIS — F419 Anxiety disorder, unspecified: Secondary | ICD-10-CM | POA: Diagnosis not present

## 2022-04-17 DIAGNOSIS — Z794 Long term (current) use of insulin: Secondary | ICD-10-CM | POA: Diagnosis not present

## 2022-04-17 DIAGNOSIS — E1159 Type 2 diabetes mellitus with other circulatory complications: Secondary | ICD-10-CM | POA: Diagnosis not present

## 2022-04-17 DIAGNOSIS — I87311 Chronic venous hypertension (idiopathic) with ulcer of right lower extremity: Secondary | ICD-10-CM | POA: Diagnosis not present

## 2022-04-17 DIAGNOSIS — L97919 Non-pressure chronic ulcer of unspecified part of right lower leg with unspecified severity: Secondary | ICD-10-CM | POA: Diagnosis not present

## 2022-04-17 DIAGNOSIS — E1165 Type 2 diabetes mellitus with hyperglycemia: Secondary | ICD-10-CM | POA: Diagnosis not present

## 2022-04-17 DIAGNOSIS — Z7984 Long term (current) use of oral hypoglycemic drugs: Secondary | ICD-10-CM | POA: Diagnosis not present

## 2022-04-17 DIAGNOSIS — Z6838 Body mass index (BMI) 38.0-38.9, adult: Secondary | ICD-10-CM | POA: Diagnosis not present

## 2022-04-17 DIAGNOSIS — I10 Essential (primary) hypertension: Secondary | ICD-10-CM | POA: Diagnosis not present

## 2022-04-17 DIAGNOSIS — R2243 Localized swelling, mass and lump, lower limb, bilateral: Secondary | ICD-10-CM | POA: Diagnosis not present

## 2022-04-24 DIAGNOSIS — E7800 Pure hypercholesterolemia, unspecified: Secondary | ICD-10-CM | POA: Diagnosis not present

## 2022-04-24 DIAGNOSIS — I83012 Varicose veins of right lower extremity with ulcer of calf: Secondary | ICD-10-CM | POA: Diagnosis not present

## 2022-04-24 DIAGNOSIS — L97919 Non-pressure chronic ulcer of unspecified part of right lower leg with unspecified severity: Secondary | ICD-10-CM | POA: Diagnosis not present

## 2022-04-24 DIAGNOSIS — I872 Venous insufficiency (chronic) (peripheral): Secondary | ICD-10-CM | POA: Diagnosis not present

## 2022-04-24 DIAGNOSIS — L97212 Non-pressure chronic ulcer of right calf with fat layer exposed: Secondary | ICD-10-CM | POA: Diagnosis not present

## 2022-04-24 DIAGNOSIS — E1159 Type 2 diabetes mellitus with other circulatory complications: Secondary | ICD-10-CM | POA: Diagnosis not present

## 2022-04-24 DIAGNOSIS — Z7982 Long term (current) use of aspirin: Secondary | ICD-10-CM | POA: Diagnosis not present

## 2022-04-24 DIAGNOSIS — I10 Essential (primary) hypertension: Secondary | ICD-10-CM | POA: Diagnosis not present

## 2022-04-24 DIAGNOSIS — Z880 Allergy status to penicillin: Secondary | ICD-10-CM | POA: Diagnosis not present

## 2022-04-24 DIAGNOSIS — Z794 Long term (current) use of insulin: Secondary | ICD-10-CM | POA: Diagnosis not present

## 2022-05-08 DIAGNOSIS — L97212 Non-pressure chronic ulcer of right calf with fat layer exposed: Secondary | ICD-10-CM | POA: Diagnosis not present

## 2022-05-08 DIAGNOSIS — I83012 Varicose veins of right lower extremity with ulcer of calf: Secondary | ICD-10-CM | POA: Diagnosis not present

## 2022-05-13 DIAGNOSIS — R2243 Localized swelling, mass and lump, lower limb, bilateral: Secondary | ICD-10-CM | POA: Diagnosis not present

## 2022-05-13 DIAGNOSIS — I87311 Chronic venous hypertension (idiopathic) with ulcer of right lower extremity: Secondary | ICD-10-CM | POA: Diagnosis not present

## 2022-05-13 DIAGNOSIS — L97919 Non-pressure chronic ulcer of unspecified part of right lower leg with unspecified severity: Secondary | ICD-10-CM | POA: Diagnosis not present

## 2022-05-13 DIAGNOSIS — E1165 Type 2 diabetes mellitus with hyperglycemia: Secondary | ICD-10-CM | POA: Diagnosis not present

## 2022-05-14 DIAGNOSIS — I872 Venous insufficiency (chronic) (peripheral): Secondary | ICD-10-CM | POA: Diagnosis not present

## 2022-05-15 DIAGNOSIS — I10 Essential (primary) hypertension: Secondary | ICD-10-CM | POA: Diagnosis not present

## 2022-05-18 DIAGNOSIS — C50212 Malignant neoplasm of upper-inner quadrant of left female breast: Secondary | ICD-10-CM | POA: Diagnosis not present

## 2022-05-18 DIAGNOSIS — Z170 Estrogen receptor positive status [ER+]: Secondary | ICD-10-CM | POA: Diagnosis not present

## 2022-05-18 DIAGNOSIS — C50912 Malignant neoplasm of unspecified site of left female breast: Secondary | ICD-10-CM | POA: Diagnosis not present

## 2022-05-18 DIAGNOSIS — R928 Other abnormal and inconclusive findings on diagnostic imaging of breast: Secondary | ICD-10-CM | POA: Diagnosis not present

## 2022-05-19 DIAGNOSIS — Z6838 Body mass index (BMI) 38.0-38.9, adult: Secondary | ICD-10-CM | POA: Diagnosis not present

## 2022-05-19 DIAGNOSIS — I10 Essential (primary) hypertension: Secondary | ICD-10-CM | POA: Diagnosis not present

## 2022-05-19 DIAGNOSIS — E1159 Type 2 diabetes mellitus with other circulatory complications: Secondary | ICD-10-CM | POA: Diagnosis not present

## 2022-05-19 DIAGNOSIS — R2243 Localized swelling, mass and lump, lower limb, bilateral: Secondary | ICD-10-CM | POA: Diagnosis not present

## 2022-05-19 DIAGNOSIS — I87311 Chronic venous hypertension (idiopathic) with ulcer of right lower extremity: Secondary | ICD-10-CM | POA: Diagnosis not present

## 2022-05-19 DIAGNOSIS — L97919 Non-pressure chronic ulcer of unspecified part of right lower leg with unspecified severity: Secondary | ICD-10-CM | POA: Diagnosis not present

## 2022-05-19 DIAGNOSIS — E1165 Type 2 diabetes mellitus with hyperglycemia: Secondary | ICD-10-CM | POA: Diagnosis not present

## 2022-05-19 DIAGNOSIS — Z7984 Long term (current) use of oral hypoglycemic drugs: Secondary | ICD-10-CM | POA: Diagnosis not present

## 2022-05-19 DIAGNOSIS — Z794 Long term (current) use of insulin: Secondary | ICD-10-CM | POA: Diagnosis not present

## 2022-05-22 DIAGNOSIS — I83012 Varicose veins of right lower extremity with ulcer of calf: Secondary | ICD-10-CM | POA: Diagnosis not present

## 2022-05-22 DIAGNOSIS — L97212 Non-pressure chronic ulcer of right calf with fat layer exposed: Secondary | ICD-10-CM | POA: Diagnosis not present

## 2022-05-22 DIAGNOSIS — I87312 Chronic venous hypertension (idiopathic) with ulcer of left lower extremity: Secondary | ICD-10-CM | POA: Diagnosis not present

## 2022-05-22 DIAGNOSIS — I87311 Chronic venous hypertension (idiopathic) with ulcer of right lower extremity: Secondary | ICD-10-CM | POA: Diagnosis not present

## 2022-05-22 DIAGNOSIS — L97919 Non-pressure chronic ulcer of unspecified part of right lower leg with unspecified severity: Secondary | ICD-10-CM | POA: Diagnosis not present

## 2022-06-03 DIAGNOSIS — Z170 Estrogen receptor positive status [ER+]: Secondary | ICD-10-CM | POA: Diagnosis not present

## 2022-06-03 DIAGNOSIS — C50912 Malignant neoplasm of unspecified site of left female breast: Secondary | ICD-10-CM | POA: Diagnosis not present

## 2022-06-03 DIAGNOSIS — N6489 Other specified disorders of breast: Secondary | ICD-10-CM | POA: Diagnosis not present

## 2022-06-03 DIAGNOSIS — R92322 Mammographic fibroglandular density, left breast: Secondary | ICD-10-CM | POA: Diagnosis not present

## 2022-06-03 DIAGNOSIS — R928 Other abnormal and inconclusive findings on diagnostic imaging of breast: Secondary | ICD-10-CM | POA: Diagnosis not present

## 2022-06-04 DIAGNOSIS — I872 Venous insufficiency (chronic) (peripheral): Secondary | ICD-10-CM | POA: Diagnosis not present

## 2022-06-05 DIAGNOSIS — Z7982 Long term (current) use of aspirin: Secondary | ICD-10-CM | POA: Diagnosis not present

## 2022-06-05 DIAGNOSIS — Z79811 Long term (current) use of aromatase inhibitors: Secondary | ICD-10-CM | POA: Diagnosis not present

## 2022-06-05 DIAGNOSIS — E7800 Pure hypercholesterolemia, unspecified: Secondary | ICD-10-CM | POA: Diagnosis not present

## 2022-06-05 DIAGNOSIS — Z4800 Encounter for change or removal of nonsurgical wound dressing: Secondary | ICD-10-CM | POA: Diagnosis not present

## 2022-06-05 DIAGNOSIS — Z880 Allergy status to penicillin: Secondary | ICD-10-CM | POA: Diagnosis not present

## 2022-06-05 DIAGNOSIS — Z7984 Long term (current) use of oral hypoglycemic drugs: Secondary | ICD-10-CM | POA: Diagnosis not present

## 2022-06-05 DIAGNOSIS — Z794 Long term (current) use of insulin: Secondary | ICD-10-CM | POA: Diagnosis not present

## 2022-06-05 DIAGNOSIS — L97212 Non-pressure chronic ulcer of right calf with fat layer exposed: Secondary | ICD-10-CM | POA: Diagnosis not present

## 2022-06-05 DIAGNOSIS — I872 Venous insufficiency (chronic) (peripheral): Secondary | ICD-10-CM | POA: Diagnosis not present

## 2022-06-05 DIAGNOSIS — C50919 Malignant neoplasm of unspecified site of unspecified female breast: Secondary | ICD-10-CM | POA: Diagnosis not present

## 2022-06-05 DIAGNOSIS — Z79899 Other long term (current) drug therapy: Secondary | ICD-10-CM | POA: Diagnosis not present

## 2022-06-05 DIAGNOSIS — E1151 Type 2 diabetes mellitus with diabetic peripheral angiopathy without gangrene: Secondary | ICD-10-CM | POA: Diagnosis not present

## 2022-06-05 DIAGNOSIS — I10 Essential (primary) hypertension: Secondary | ICD-10-CM | POA: Diagnosis not present

## 2022-06-08 DIAGNOSIS — Z170 Estrogen receptor positive status [ER+]: Secondary | ICD-10-CM | POA: Diagnosis not present

## 2022-06-08 DIAGNOSIS — L659 Nonscarring hair loss, unspecified: Secondary | ICD-10-CM | POA: Diagnosis not present

## 2022-06-08 DIAGNOSIS — C50912 Malignant neoplasm of unspecified site of left female breast: Secondary | ICD-10-CM | POA: Diagnosis not present

## 2022-06-15 DIAGNOSIS — I10 Essential (primary) hypertension: Secondary | ICD-10-CM | POA: Diagnosis not present

## 2022-06-15 DIAGNOSIS — E1165 Type 2 diabetes mellitus with hyperglycemia: Secondary | ICD-10-CM | POA: Diagnosis not present

## 2022-06-15 DIAGNOSIS — Z299 Encounter for prophylactic measures, unspecified: Secondary | ICD-10-CM | POA: Diagnosis not present

## 2022-06-15 DIAGNOSIS — Z23 Encounter for immunization: Secondary | ICD-10-CM | POA: Diagnosis not present

## 2022-06-15 DIAGNOSIS — E119 Type 2 diabetes mellitus without complications: Secondary | ICD-10-CM | POA: Diagnosis not present

## 2022-06-17 DIAGNOSIS — I87311 Chronic venous hypertension (idiopathic) with ulcer of right lower extremity: Secondary | ICD-10-CM | POA: Diagnosis not present

## 2022-06-17 DIAGNOSIS — Z6838 Body mass index (BMI) 38.0-38.9, adult: Secondary | ICD-10-CM | POA: Diagnosis not present

## 2022-06-17 DIAGNOSIS — N6320 Unspecified lump in the left breast, unspecified quadrant: Secondary | ICD-10-CM | POA: Diagnosis not present

## 2022-06-17 DIAGNOSIS — I10 Essential (primary) hypertension: Secondary | ICD-10-CM | POA: Diagnosis not present

## 2022-06-17 DIAGNOSIS — E1165 Type 2 diabetes mellitus with hyperglycemia: Secondary | ICD-10-CM | POA: Diagnosis not present

## 2022-06-17 DIAGNOSIS — Z794 Long term (current) use of insulin: Secondary | ICD-10-CM | POA: Diagnosis not present

## 2022-06-17 DIAGNOSIS — L97919 Non-pressure chronic ulcer of unspecified part of right lower leg with unspecified severity: Secondary | ICD-10-CM | POA: Diagnosis not present

## 2022-06-17 DIAGNOSIS — Z7984 Long term (current) use of oral hypoglycemic drugs: Secondary | ICD-10-CM | POA: Diagnosis not present

## 2022-06-17 DIAGNOSIS — E1159 Type 2 diabetes mellitus with other circulatory complications: Secondary | ICD-10-CM | POA: Diagnosis not present

## 2022-06-19 DIAGNOSIS — I83012 Varicose veins of right lower extremity with ulcer of calf: Secondary | ICD-10-CM | POA: Diagnosis not present

## 2022-06-19 DIAGNOSIS — L97212 Non-pressure chronic ulcer of right calf with fat layer exposed: Secondary | ICD-10-CM | POA: Diagnosis not present

## 2022-07-03 DIAGNOSIS — Z7982 Long term (current) use of aspirin: Secondary | ICD-10-CM | POA: Diagnosis not present

## 2022-07-03 DIAGNOSIS — E7800 Pure hypercholesterolemia, unspecified: Secondary | ICD-10-CM | POA: Diagnosis not present

## 2022-07-03 DIAGNOSIS — Z7984 Long term (current) use of oral hypoglycemic drugs: Secondary | ICD-10-CM | POA: Diagnosis not present

## 2022-07-03 DIAGNOSIS — I10 Essential (primary) hypertension: Secondary | ICD-10-CM | POA: Diagnosis not present

## 2022-07-03 DIAGNOSIS — I890 Lymphedema, not elsewhere classified: Secondary | ICD-10-CM | POA: Diagnosis not present

## 2022-07-03 DIAGNOSIS — L97212 Non-pressure chronic ulcer of right calf with fat layer exposed: Secondary | ICD-10-CM | POA: Diagnosis not present

## 2022-07-03 DIAGNOSIS — Z880 Allergy status to penicillin: Secondary | ICD-10-CM | POA: Diagnosis not present

## 2022-07-03 DIAGNOSIS — E11620 Type 2 diabetes mellitus with diabetic dermatitis: Secondary | ICD-10-CM | POA: Diagnosis not present

## 2022-07-03 DIAGNOSIS — I872 Venous insufficiency (chronic) (peripheral): Secondary | ICD-10-CM | POA: Diagnosis not present

## 2022-07-03 DIAGNOSIS — Z794 Long term (current) use of insulin: Secondary | ICD-10-CM | POA: Diagnosis not present

## 2022-07-03 DIAGNOSIS — Z79899 Other long term (current) drug therapy: Secondary | ICD-10-CM | POA: Diagnosis not present

## 2022-07-03 DIAGNOSIS — E1159 Type 2 diabetes mellitus with other circulatory complications: Secondary | ICD-10-CM | POA: Diagnosis not present

## 2022-07-03 DIAGNOSIS — I83012 Varicose veins of right lower extremity with ulcer of calf: Secondary | ICD-10-CM | POA: Diagnosis not present

## 2022-07-08 DIAGNOSIS — E1165 Type 2 diabetes mellitus with hyperglycemia: Secondary | ICD-10-CM | POA: Diagnosis not present

## 2022-07-08 DIAGNOSIS — L97919 Non-pressure chronic ulcer of unspecified part of right lower leg with unspecified severity: Secondary | ICD-10-CM | POA: Diagnosis not present

## 2022-07-08 DIAGNOSIS — I87311 Chronic venous hypertension (idiopathic) with ulcer of right lower extremity: Secondary | ICD-10-CM | POA: Diagnosis not present

## 2022-07-08 DIAGNOSIS — Z794 Long term (current) use of insulin: Secondary | ICD-10-CM | POA: Diagnosis not present

## 2022-07-10 DIAGNOSIS — E119 Type 2 diabetes mellitus without complications: Secondary | ICD-10-CM | POA: Diagnosis not present

## 2022-07-10 DIAGNOSIS — Z4800 Encounter for change or removal of nonsurgical wound dressing: Secondary | ICD-10-CM | POA: Diagnosis not present

## 2022-07-10 DIAGNOSIS — L97212 Non-pressure chronic ulcer of right calf with fat layer exposed: Secondary | ICD-10-CM | POA: Diagnosis not present

## 2022-07-10 DIAGNOSIS — Z7984 Long term (current) use of oral hypoglycemic drugs: Secondary | ICD-10-CM | POA: Diagnosis not present

## 2022-07-10 DIAGNOSIS — I890 Lymphedema, not elsewhere classified: Secondary | ICD-10-CM | POA: Diagnosis not present

## 2022-07-10 DIAGNOSIS — E7800 Pure hypercholesterolemia, unspecified: Secondary | ICD-10-CM | POA: Diagnosis not present

## 2022-07-10 DIAGNOSIS — I10 Essential (primary) hypertension: Secondary | ICD-10-CM | POA: Diagnosis not present

## 2022-07-10 DIAGNOSIS — Z794 Long term (current) use of insulin: Secondary | ICD-10-CM | POA: Diagnosis not present

## 2022-07-10 DIAGNOSIS — I83012 Varicose veins of right lower extremity with ulcer of calf: Secondary | ICD-10-CM | POA: Diagnosis not present

## 2022-07-10 DIAGNOSIS — Z7982 Long term (current) use of aspirin: Secondary | ICD-10-CM | POA: Diagnosis not present

## 2022-07-13 DIAGNOSIS — Z1339 Encounter for screening examination for other mental health and behavioral disorders: Secondary | ICD-10-CM | POA: Diagnosis not present

## 2022-07-13 DIAGNOSIS — Z1331 Encounter for screening for depression: Secondary | ICD-10-CM | POA: Diagnosis not present

## 2022-07-13 DIAGNOSIS — Z6841 Body Mass Index (BMI) 40.0 and over, adult: Secondary | ICD-10-CM | POA: Diagnosis not present

## 2022-07-13 DIAGNOSIS — Z79899 Other long term (current) drug therapy: Secondary | ICD-10-CM | POA: Diagnosis not present

## 2022-07-13 DIAGNOSIS — Z789 Other specified health status: Secondary | ICD-10-CM | POA: Diagnosis not present

## 2022-07-13 DIAGNOSIS — Z299 Encounter for prophylactic measures, unspecified: Secondary | ICD-10-CM | POA: Diagnosis not present

## 2022-07-13 DIAGNOSIS — Z0000 Encounter for general adult medical examination without abnormal findings: Secondary | ICD-10-CM | POA: Diagnosis not present

## 2022-07-13 DIAGNOSIS — C50912 Malignant neoplasm of unspecified site of left female breast: Secondary | ICD-10-CM | POA: Diagnosis not present

## 2022-07-13 DIAGNOSIS — Z7189 Other specified counseling: Secondary | ICD-10-CM | POA: Diagnosis not present

## 2022-07-13 DIAGNOSIS — I10 Essential (primary) hypertension: Secondary | ICD-10-CM | POA: Diagnosis not present

## 2022-07-13 DIAGNOSIS — E7800 Pure hypercholesterolemia, unspecified: Secondary | ICD-10-CM | POA: Diagnosis not present

## 2022-07-22 DIAGNOSIS — I83012 Varicose veins of right lower extremity with ulcer of calf: Secondary | ICD-10-CM | POA: Diagnosis not present

## 2022-07-22 DIAGNOSIS — Z7984 Long term (current) use of oral hypoglycemic drugs: Secondary | ICD-10-CM | POA: Diagnosis not present

## 2022-07-22 DIAGNOSIS — E119 Type 2 diabetes mellitus without complications: Secondary | ICD-10-CM | POA: Diagnosis not present

## 2022-07-22 DIAGNOSIS — Z7982 Long term (current) use of aspirin: Secondary | ICD-10-CM | POA: Diagnosis not present

## 2022-07-22 DIAGNOSIS — I10 Essential (primary) hypertension: Secondary | ICD-10-CM | POA: Diagnosis not present

## 2022-07-22 DIAGNOSIS — I890 Lymphedema, not elsewhere classified: Secondary | ICD-10-CM | POA: Diagnosis not present

## 2022-07-22 DIAGNOSIS — E7800 Pure hypercholesterolemia, unspecified: Secondary | ICD-10-CM | POA: Diagnosis not present

## 2022-07-22 DIAGNOSIS — Z794 Long term (current) use of insulin: Secondary | ICD-10-CM | POA: Diagnosis not present

## 2022-07-22 DIAGNOSIS — Z4800 Encounter for change or removal of nonsurgical wound dressing: Secondary | ICD-10-CM | POA: Diagnosis not present

## 2022-07-22 DIAGNOSIS — L97212 Non-pressure chronic ulcer of right calf with fat layer exposed: Secondary | ICD-10-CM | POA: Diagnosis not present

## 2022-07-31 DIAGNOSIS — I890 Lymphedema, not elsewhere classified: Secondary | ICD-10-CM | POA: Diagnosis not present

## 2022-07-31 DIAGNOSIS — I872 Venous insufficiency (chronic) (peripheral): Secondary | ICD-10-CM | POA: Diagnosis not present

## 2022-07-31 DIAGNOSIS — E7800 Pure hypercholesterolemia, unspecified: Secondary | ICD-10-CM | POA: Diagnosis not present

## 2022-07-31 DIAGNOSIS — L97212 Non-pressure chronic ulcer of right calf with fat layer exposed: Secondary | ICD-10-CM | POA: Diagnosis not present

## 2022-07-31 DIAGNOSIS — Z794 Long term (current) use of insulin: Secondary | ICD-10-CM | POA: Diagnosis not present

## 2022-07-31 DIAGNOSIS — R609 Edema, unspecified: Secondary | ICD-10-CM | POA: Diagnosis not present

## 2022-07-31 DIAGNOSIS — I83012 Varicose veins of right lower extremity with ulcer of calf: Secondary | ICD-10-CM | POA: Diagnosis not present

## 2022-07-31 DIAGNOSIS — Z7984 Long term (current) use of oral hypoglycemic drugs: Secondary | ICD-10-CM | POA: Diagnosis not present

## 2022-07-31 DIAGNOSIS — Z7982 Long term (current) use of aspirin: Secondary | ICD-10-CM | POA: Diagnosis not present

## 2022-07-31 DIAGNOSIS — R5381 Other malaise: Secondary | ICD-10-CM | POA: Diagnosis not present

## 2022-07-31 DIAGNOSIS — E11622 Type 2 diabetes mellitus with other skin ulcer: Secondary | ICD-10-CM | POA: Diagnosis not present

## 2022-07-31 DIAGNOSIS — I10 Essential (primary) hypertension: Secondary | ICD-10-CM | POA: Diagnosis not present

## 2022-07-31 DIAGNOSIS — R5383 Other fatigue: Secondary | ICD-10-CM | POA: Diagnosis not present

## 2022-07-31 DIAGNOSIS — Z79899 Other long term (current) drug therapy: Secondary | ICD-10-CM | POA: Diagnosis not present

## 2022-08-19 DIAGNOSIS — C50212 Malignant neoplasm of upper-inner quadrant of left female breast: Secondary | ICD-10-CM | POA: Diagnosis not present

## 2022-08-19 DIAGNOSIS — Z170 Estrogen receptor positive status [ER+]: Secondary | ICD-10-CM | POA: Diagnosis not present

## 2022-09-03 DIAGNOSIS — L659 Nonscarring hair loss, unspecified: Secondary | ICD-10-CM | POA: Diagnosis not present

## 2022-09-03 DIAGNOSIS — C50912 Malignant neoplasm of unspecified site of left female breast: Secondary | ICD-10-CM | POA: Diagnosis not present

## 2022-09-03 DIAGNOSIS — Z170 Estrogen receptor positive status [ER+]: Secondary | ICD-10-CM | POA: Diagnosis not present

## 2022-09-08 IMAGING — MG MM DIGITAL SCREENING BILAT W/ TOMO AND CAD
6 of 12 series · 6 of 36 positions shown · non-contrast
Comparison: Previous exam(s).

CLINICAL DATA: Screening.

EXAM:
DIGITAL SCREENING BILATERAL MAMMOGRAM WITH TOMOSYNTHESIS AND CAD
TECHNIQUE: Bilateral screening digital craniocaudal and mediolateral oblique
mammograms were obtained. Bilateral screening digital breast
tomosynthesis was performed. The images were evaluated with
computer-aided detection.

[L MLO synth-2D (1 of 2)]
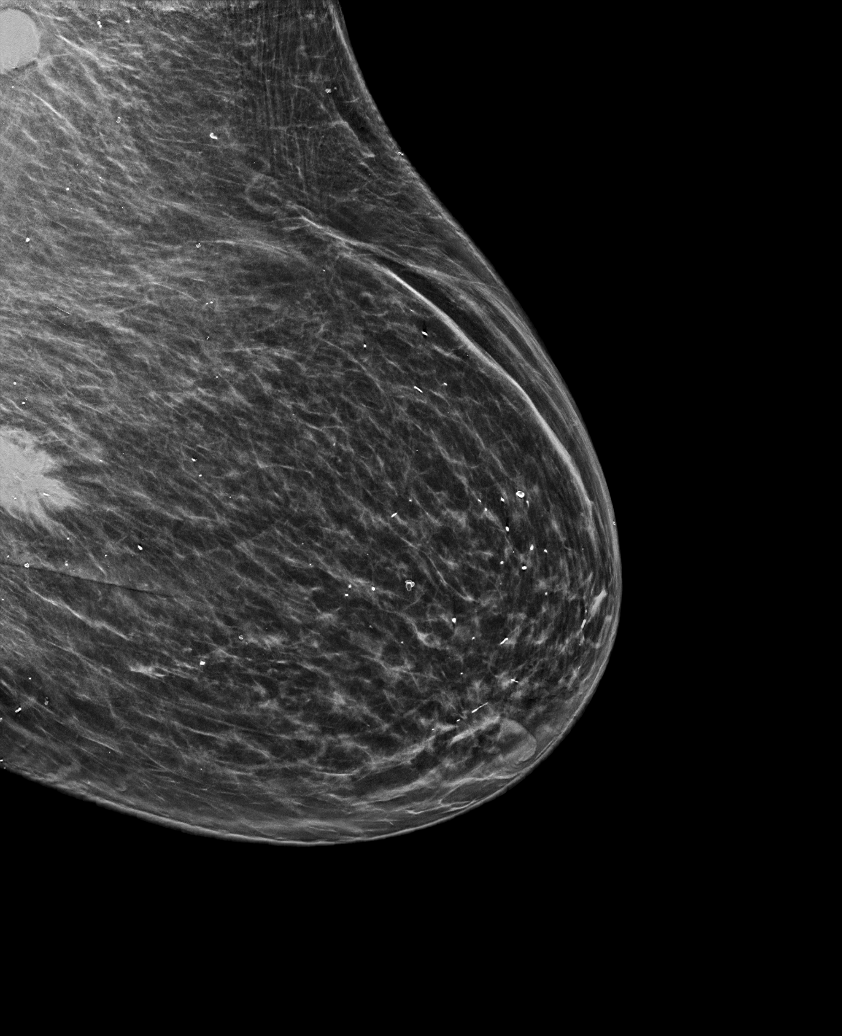

[R MLO synth-2D (1 of 2)]
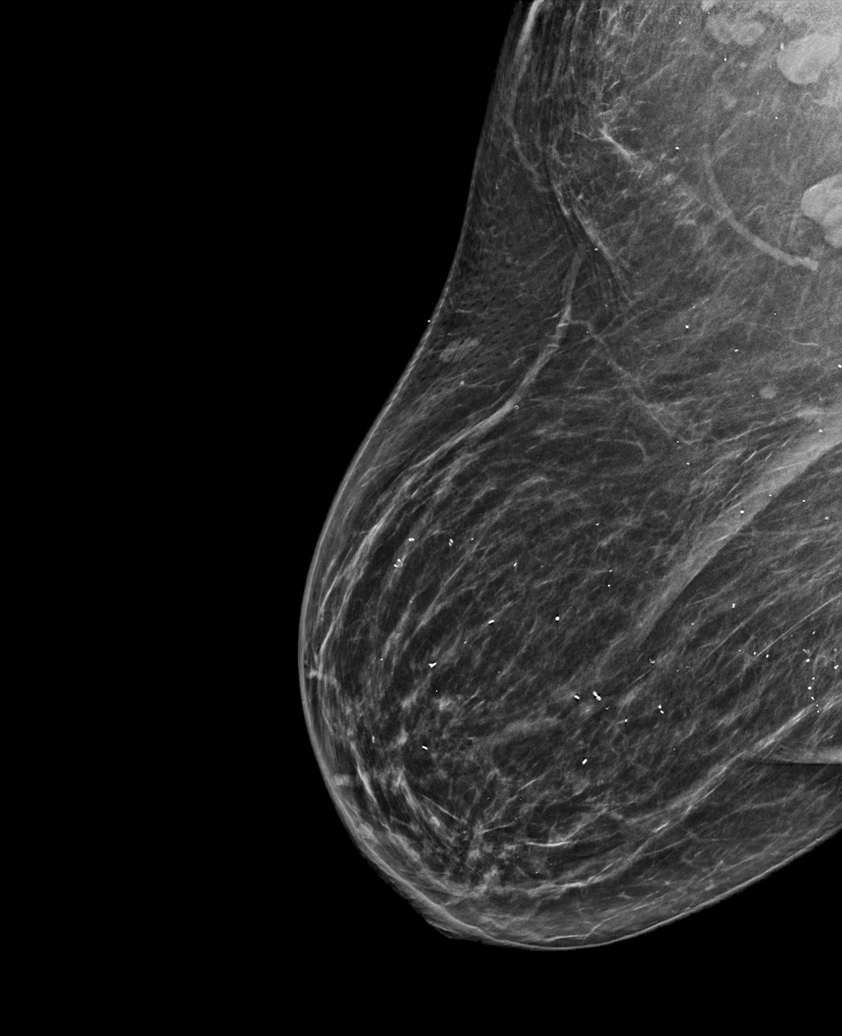

[L MLO synth-2D (2 of 2)]
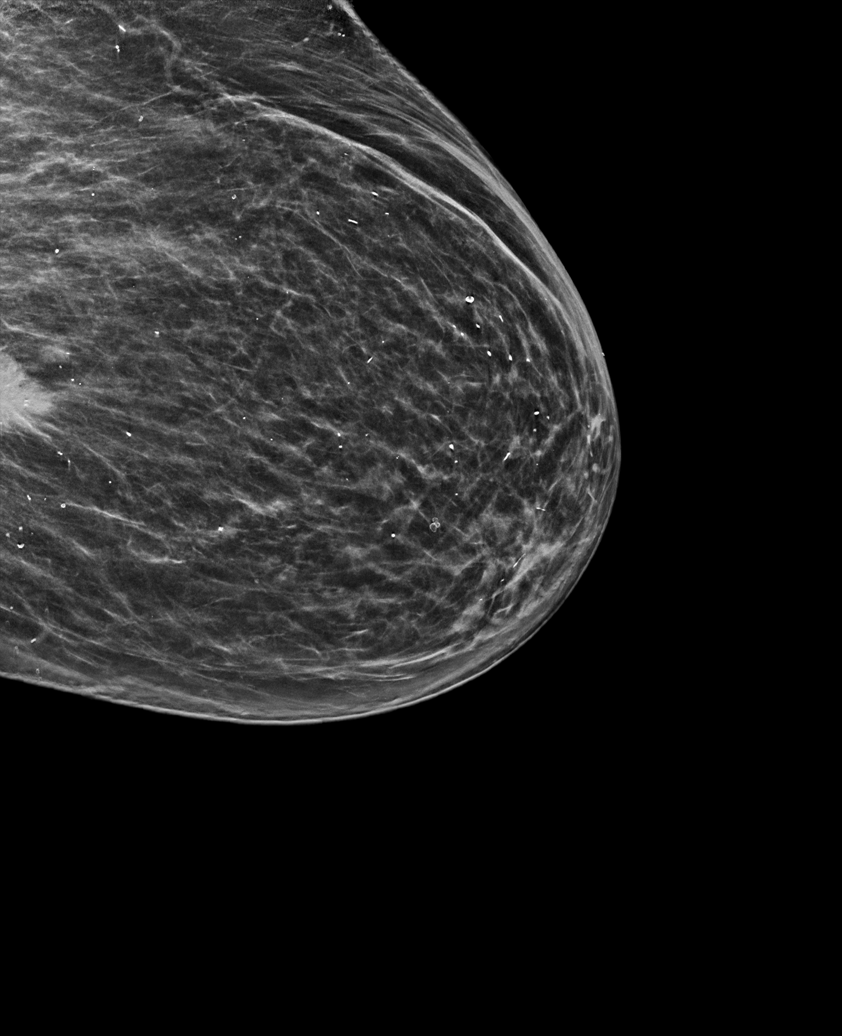

[R MLO synth-2D (2 of 2)]
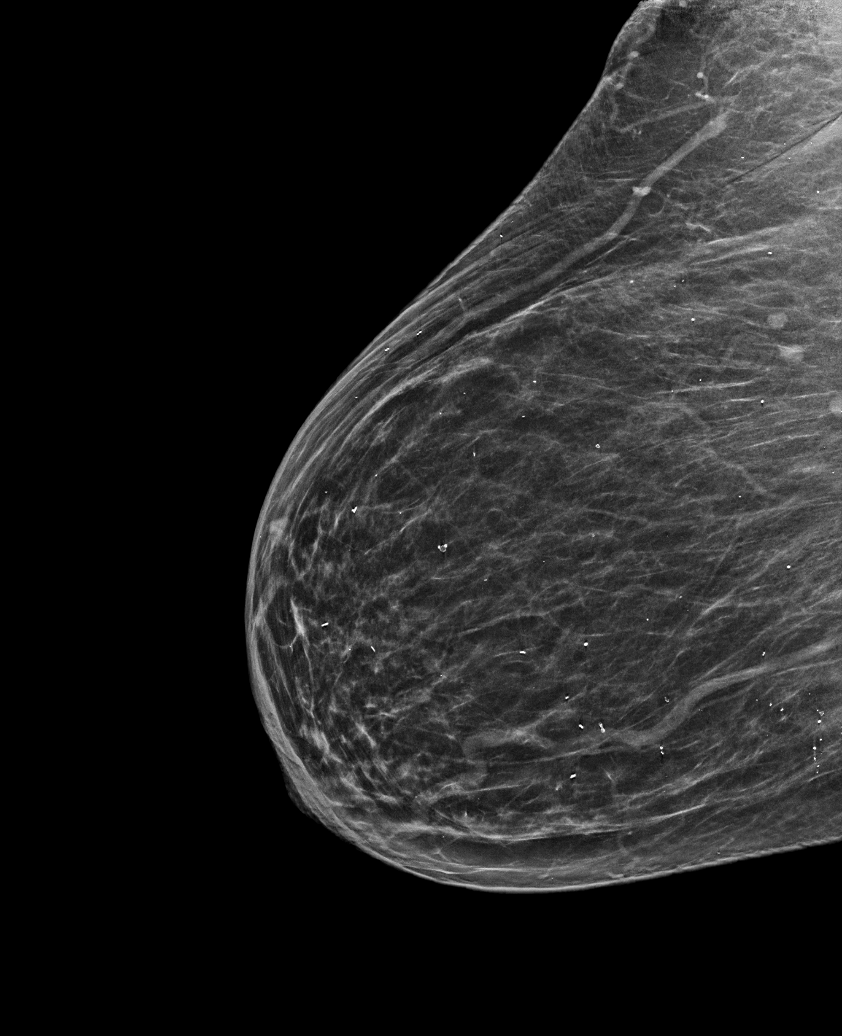

[R CC synth-2D]
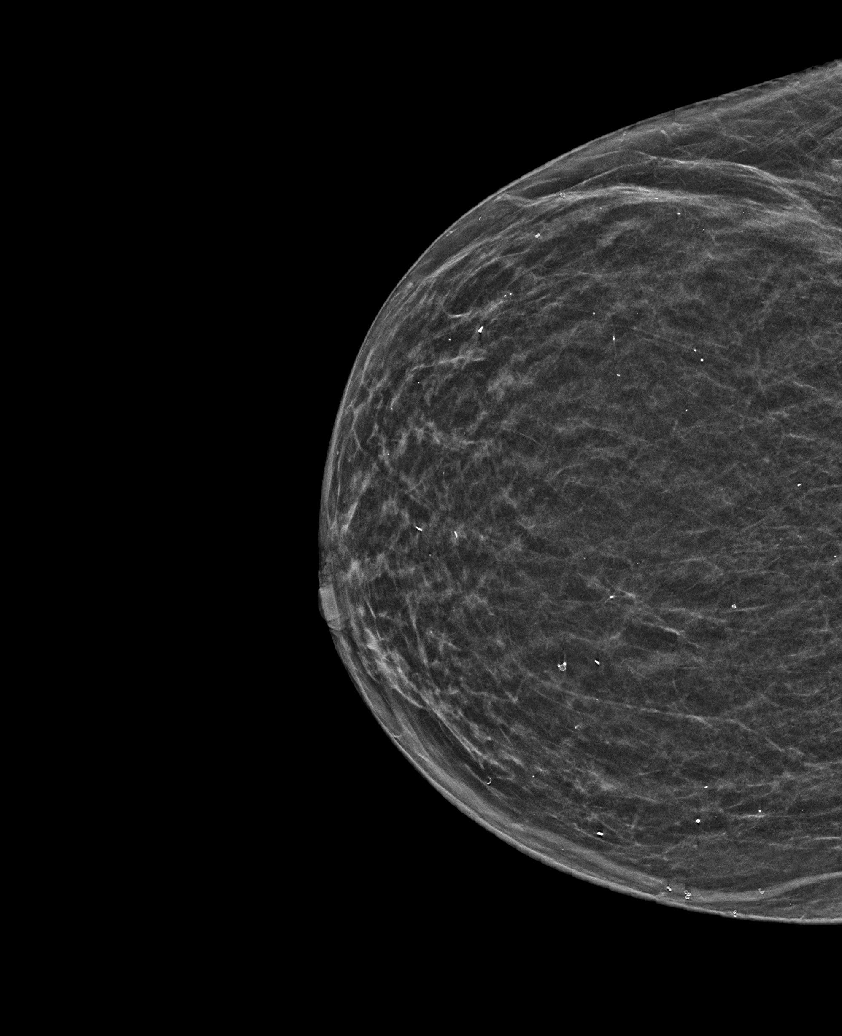

[L CC synth-2D]
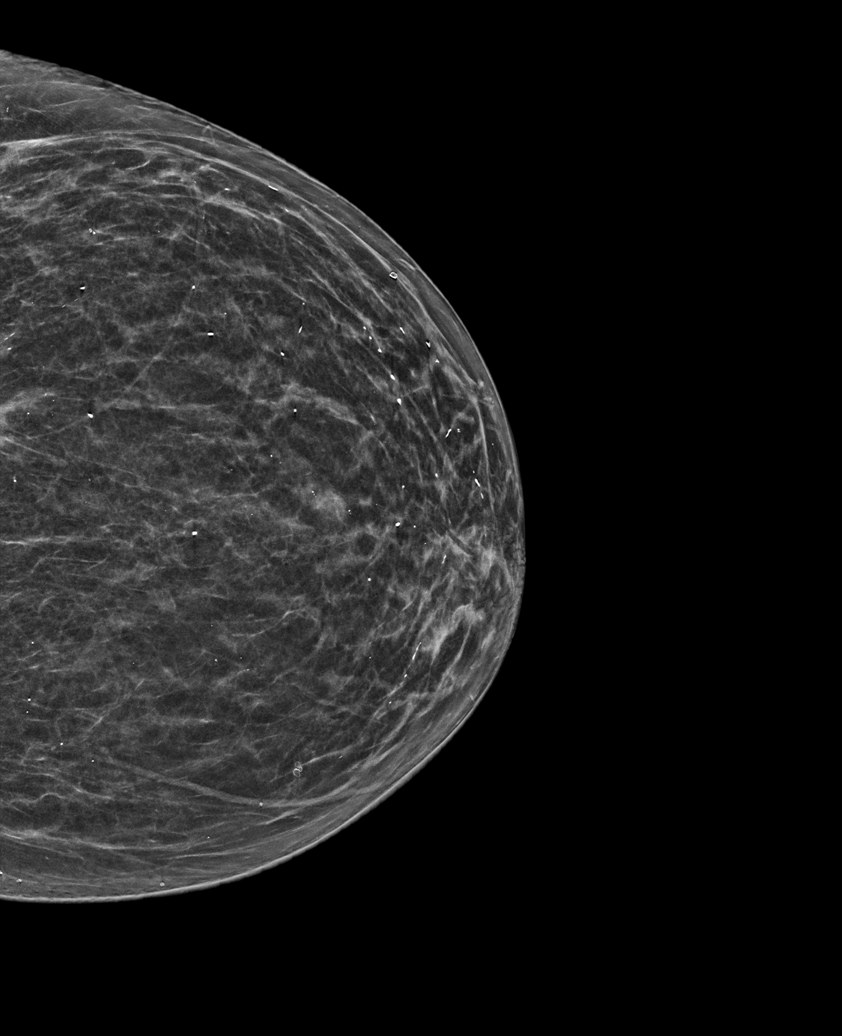

[6 of 36 positions shown; findings below may reference images not displayed]

ACR Breast Density Category b: There are scattered areas of
fibroglandular density.
FINDINGS: In the left breast, a possible mass warrants further evaluation. In
the right breast, no findings suspicious for malignancy.
IMPRESSION: Further evaluation is suggested for a possible mass in the left
breast.

RECOMMENDATION:
Diagnostic mammogram and possibly ultrasound of the left breast.
(Code:[id number])

The patient will be contacted regarding the findings, and additional
imaging will be scheduled.

BI-RADS CATEGORY  0: Incomplete. Need additional imaging evaluation
and/or prior mammograms for comparison.

## 2022-09-30 DIAGNOSIS — R921 Mammographic calcification found on diagnostic imaging of breast: Secondary | ICD-10-CM | POA: Diagnosis not present

## 2022-09-30 DIAGNOSIS — Z170 Estrogen receptor positive status [ER+]: Secondary | ICD-10-CM | POA: Diagnosis not present

## 2022-09-30 DIAGNOSIS — Z853 Personal history of malignant neoplasm of breast: Secondary | ICD-10-CM | POA: Diagnosis not present

## 2022-09-30 DIAGNOSIS — R2232 Localized swelling, mass and lump, left upper limb: Secondary | ICD-10-CM | POA: Diagnosis not present

## 2022-09-30 DIAGNOSIS — C50912 Malignant neoplasm of unspecified site of left female breast: Secondary | ICD-10-CM | POA: Diagnosis not present

## 2022-10-14 DIAGNOSIS — I10 Essential (primary) hypertension: Secondary | ICD-10-CM | POA: Diagnosis not present

## 2022-11-14 DIAGNOSIS — I10 Essential (primary) hypertension: Secondary | ICD-10-CM | POA: Diagnosis not present

## 2022-12-02 DIAGNOSIS — C50212 Malignant neoplasm of upper-inner quadrant of left female breast: Secondary | ICD-10-CM | POA: Diagnosis not present

## 2022-12-02 DIAGNOSIS — Z170 Estrogen receptor positive status [ER+]: Secondary | ICD-10-CM | POA: Diagnosis not present

## 2022-12-08 DIAGNOSIS — Z170 Estrogen receptor positive status [ER+]: Secondary | ICD-10-CM | POA: Diagnosis not present

## 2022-12-08 DIAGNOSIS — C50212 Malignant neoplasm of upper-inner quadrant of left female breast: Secondary | ICD-10-CM | POA: Diagnosis not present

## 2022-12-15 DIAGNOSIS — I10 Essential (primary) hypertension: Secondary | ICD-10-CM | POA: Diagnosis not present

## 2023-01-15 DIAGNOSIS — I10 Essential (primary) hypertension: Secondary | ICD-10-CM | POA: Diagnosis not present

## 2023-02-14 DIAGNOSIS — I10 Essential (primary) hypertension: Secondary | ICD-10-CM | POA: Diagnosis not present

## 2023-02-17 DIAGNOSIS — C50212 Malignant neoplasm of upper-inner quadrant of left female breast: Secondary | ICD-10-CM | POA: Diagnosis not present

## 2023-02-17 DIAGNOSIS — C773 Secondary and unspecified malignant neoplasm of axilla and upper limb lymph nodes: Secondary | ICD-10-CM | POA: Diagnosis not present

## 2023-02-17 DIAGNOSIS — R92322 Mammographic fibroglandular density, left breast: Secondary | ICD-10-CM | POA: Diagnosis not present

## 2023-02-17 DIAGNOSIS — Z170 Estrogen receptor positive status [ER+]: Secondary | ICD-10-CM | POA: Diagnosis not present

## 2023-02-22 DIAGNOSIS — C50212 Malignant neoplasm of upper-inner quadrant of left female breast: Secondary | ICD-10-CM | POA: Diagnosis not present

## 2023-02-22 DIAGNOSIS — Z170 Estrogen receptor positive status [ER+]: Secondary | ICD-10-CM | POA: Diagnosis not present

## 2023-03-17 DIAGNOSIS — I10 Essential (primary) hypertension: Secondary | ICD-10-CM | POA: Diagnosis not present

## 2023-04-17 DIAGNOSIS — I10 Essential (primary) hypertension: Secondary | ICD-10-CM | POA: Diagnosis not present

## 2023-05-17 DIAGNOSIS — I10 Essential (primary) hypertension: Secondary | ICD-10-CM | POA: Diagnosis not present

## 2023-05-25 DIAGNOSIS — C50212 Malignant neoplasm of upper-inner quadrant of left female breast: Secondary | ICD-10-CM | POA: Diagnosis not present

## 2023-05-25 DIAGNOSIS — Z170 Estrogen receptor positive status [ER+]: Secondary | ICD-10-CM | POA: Diagnosis not present

## 2023-06-02 DIAGNOSIS — Z170 Estrogen receptor positive status [ER+]: Secondary | ICD-10-CM | POA: Diagnosis not present

## 2023-06-02 DIAGNOSIS — C50212 Malignant neoplasm of upper-inner quadrant of left female breast: Secondary | ICD-10-CM | POA: Diagnosis not present

## 2023-06-16 DIAGNOSIS — I10 Essential (primary) hypertension: Secondary | ICD-10-CM | POA: Diagnosis not present

## 2023-06-25 DIAGNOSIS — E7800 Pure hypercholesterolemia, unspecified: Secondary | ICD-10-CM | POA: Diagnosis not present

## 2023-06-25 DIAGNOSIS — Z1331 Encounter for screening for depression: Secondary | ICD-10-CM | POA: Diagnosis not present

## 2023-06-25 DIAGNOSIS — I10 Essential (primary) hypertension: Secondary | ICD-10-CM | POA: Diagnosis not present

## 2023-06-25 DIAGNOSIS — Z7189 Other specified counseling: Secondary | ICD-10-CM | POA: Diagnosis not present

## 2023-06-25 DIAGNOSIS — F419 Anxiety disorder, unspecified: Secondary | ICD-10-CM | POA: Diagnosis not present

## 2023-06-25 DIAGNOSIS — E1169 Type 2 diabetes mellitus with other specified complication: Secondary | ICD-10-CM | POA: Diagnosis not present

## 2023-06-25 DIAGNOSIS — Z1339 Encounter for screening examination for other mental health and behavioral disorders: Secondary | ICD-10-CM | POA: Diagnosis not present

## 2023-06-25 DIAGNOSIS — Z6841 Body Mass Index (BMI) 40.0 and over, adult: Secondary | ICD-10-CM | POA: Diagnosis not present

## 2023-06-25 DIAGNOSIS — Z299 Encounter for prophylactic measures, unspecified: Secondary | ICD-10-CM | POA: Diagnosis not present

## 2023-06-25 DIAGNOSIS — Z0000 Encounter for general adult medical examination without abnormal findings: Secondary | ICD-10-CM | POA: Diagnosis not present

## 2023-06-25 DIAGNOSIS — R5383 Other fatigue: Secondary | ICD-10-CM | POA: Diagnosis not present

## 2023-06-29 DIAGNOSIS — Z79899 Other long term (current) drug therapy: Secondary | ICD-10-CM | POA: Diagnosis not present

## 2023-06-29 DIAGNOSIS — R5383 Other fatigue: Secondary | ICD-10-CM | POA: Diagnosis not present

## 2023-06-29 DIAGNOSIS — E7800 Pure hypercholesterolemia, unspecified: Secondary | ICD-10-CM | POA: Diagnosis not present

## 2023-08-16 DIAGNOSIS — I10 Essential (primary) hypertension: Secondary | ICD-10-CM | POA: Diagnosis not present

## 2023-10-15 DIAGNOSIS — I10 Essential (primary) hypertension: Secondary | ICD-10-CM | POA: Diagnosis not present

## 2023-10-25 DIAGNOSIS — Z853 Personal history of malignant neoplasm of breast: Secondary | ICD-10-CM | POA: Diagnosis not present

## 2023-10-25 DIAGNOSIS — N6325 Unspecified lump in the left breast, overlapping quadrants: Secondary | ICD-10-CM | POA: Diagnosis not present

## 2023-10-25 DIAGNOSIS — C50812 Malignant neoplasm of overlapping sites of left female breast: Secondary | ICD-10-CM | POA: Diagnosis not present

## 2023-12-15 DIAGNOSIS — I10 Essential (primary) hypertension: Secondary | ICD-10-CM | POA: Diagnosis not present

## 2023-12-23 DIAGNOSIS — Z170 Estrogen receptor positive status [ER+]: Secondary | ICD-10-CM | POA: Diagnosis not present

## 2023-12-23 DIAGNOSIS — C50212 Malignant neoplasm of upper-inner quadrant of left female breast: Secondary | ICD-10-CM | POA: Diagnosis not present

## 2024-01-15 DIAGNOSIS — I10 Essential (primary) hypertension: Secondary | ICD-10-CM | POA: Diagnosis not present

## 2024-06-16 DIAGNOSIS — E119 Type 2 diabetes mellitus without complications: Secondary | ICD-10-CM | POA: Diagnosis not present

## 2024-06-16 DIAGNOSIS — F419 Anxiety disorder, unspecified: Secondary | ICD-10-CM | POA: Diagnosis not present

## 2024-06-16 DIAGNOSIS — Z23 Encounter for immunization: Secondary | ICD-10-CM | POA: Diagnosis not present

## 2024-06-16 DIAGNOSIS — E2839 Other primary ovarian failure: Secondary | ICD-10-CM | POA: Diagnosis not present

## 2024-06-16 DIAGNOSIS — I10 Essential (primary) hypertension: Secondary | ICD-10-CM | POA: Diagnosis not present

## 2024-06-16 DIAGNOSIS — Z6841 Body Mass Index (BMI) 40.0 and over, adult: Secondary | ICD-10-CM | POA: Diagnosis not present

## 2024-06-16 DIAGNOSIS — Z1339 Encounter for screening examination for other mental health and behavioral disorders: Secondary | ICD-10-CM | POA: Diagnosis not present

## 2024-06-16 DIAGNOSIS — Z0000 Encounter for general adult medical examination without abnormal findings: Secondary | ICD-10-CM | POA: Diagnosis not present

## 2024-06-16 DIAGNOSIS — Z299 Encounter for prophylactic measures, unspecified: Secondary | ICD-10-CM | POA: Diagnosis not present

## 2024-06-16 DIAGNOSIS — Z7189 Other specified counseling: Secondary | ICD-10-CM | POA: Diagnosis not present

## 2024-06-16 DIAGNOSIS — Z1331 Encounter for screening for depression: Secondary | ICD-10-CM | POA: Diagnosis not present

## 2024-06-23 DIAGNOSIS — Z79899 Other long term (current) drug therapy: Secondary | ICD-10-CM | POA: Diagnosis not present

## 2024-06-23 DIAGNOSIS — E7800 Pure hypercholesterolemia, unspecified: Secondary | ICD-10-CM | POA: Diagnosis not present
